# Patient Record
Sex: Male | Born: 1965 | Race: White | Hispanic: Yes | Marital: Married | Smoking: Former smoker
Health system: Southern US, Community
[De-identification: ages and names within clinical notes are randomized; demographics above are authoritative.]

## PROBLEM LIST (undated history)

## (undated) DIAGNOSIS — Z87442 Personal history of urinary calculi: Secondary | ICD-10-CM

## (undated) DIAGNOSIS — Z860101 Personal history of adenomatous and serrated colon polyps: Secondary | ICD-10-CM

## (undated) DIAGNOSIS — Z86010 Personal history of colonic polyps: Secondary | ICD-10-CM

## (undated) DIAGNOSIS — K621 Rectal polyp: Secondary | ICD-10-CM

---

## 1983-11-09 DIAGNOSIS — S82401A Unspecified fracture of shaft of right fibula, initial encounter for closed fracture: Secondary | ICD-10-CM

## 1983-11-09 HISTORY — DX: Unspecified fracture of shaft of right fibula, initial encounter for closed fracture: S82.401A

## 2004-09-10 ENCOUNTER — Ambulatory Visit: Payer: Self-pay | Admitting: Internal Medicine

## 2004-09-10 ENCOUNTER — Ambulatory Visit (HOSPITAL_COMMUNITY): Admission: RE | Admit: 2004-09-10 | Discharge: 2004-09-10 | Payer: Self-pay | Admitting: Internal Medicine

## 2004-10-20 ENCOUNTER — Ambulatory Visit: Payer: Self-pay | Admitting: Internal Medicine

## 2012-04-17 ENCOUNTER — Emergency Department (HOSPITAL_COMMUNITY)
Admission: EM | Admit: 2012-04-17 | Discharge: 2012-04-18 | Disposition: A | Discharge status: Home / Self Care | Payer: Self-pay | Attending: Emergency Medicine | Admitting: Emergency Medicine

## 2012-04-17 ENCOUNTER — Encounter (HOSPITAL_COMMUNITY): Payer: Self-pay | Admitting: *Deleted

## 2012-04-17 DIAGNOSIS — N200 Calculus of kidney: Secondary | ICD-10-CM | POA: Insufficient documentation

## 2012-04-17 DIAGNOSIS — R109 Unspecified abdominal pain: Secondary | ICD-10-CM | POA: Insufficient documentation

## 2012-04-17 DIAGNOSIS — N1330 Unspecified hydronephrosis: Secondary | ICD-10-CM | POA: Insufficient documentation

## 2012-04-17 LAB — DIFFERENTIAL
Eosinophils Absolute: 0.1 10*3/uL (ref 0.0–0.7)
Lymphs Abs: 3 10*3/uL (ref 0.7–4.0)
Monocytes Absolute: 0.9 10*3/uL (ref 0.1–1.0)
Monocytes Relative: 6 % (ref 3–12)
Neutro Abs: 11.6 10*3/uL — ABNORMAL HIGH (ref 1.7–7.7)
Neutrophils Relative %: 74 % (ref 43–77)

## 2012-04-17 LAB — CBC
HCT: 42.9 % (ref 39.0–52.0)
Hemoglobin: 15 g/dL (ref 13.0–17.0)
MCH: 29.9 pg (ref 26.0–34.0)
RBC: 5.02 MIL/uL (ref 4.22–5.81)

## 2012-04-17 NOTE — ED Notes (Addendum)
Pt stated he stated having flank back pain on his left side at 10:00pm. Pt states he has not taken anything for it/ pt states his pain is at a level ten. Pt states that he has been nauseated but has not vomited, pt states his had the urge to urinated but is unable to

## 2012-04-17 NOTE — ED Notes (Signed)
Lt flank pain for the past 2 hours.. No bloody urine.  He canot void

## 2012-04-18 ENCOUNTER — Emergency Department (HOSPITAL_COMMUNITY): Payer: Self-pay

## 2012-04-18 ENCOUNTER — Encounter (HOSPITAL_COMMUNITY): Payer: Self-pay | Admitting: Radiology

## 2012-04-18 LAB — COMPREHENSIVE METABOLIC PANEL
Alkaline Phosphatase: 57 U/L (ref 39–117)
BUN: 29 mg/dL — ABNORMAL HIGH (ref 6–23)
Chloride: 101 mEq/L (ref 96–112)
Creatinine, Ser: 1.13 mg/dL (ref 0.50–1.35)
GFR calc Af Amer: 88 mL/min — ABNORMAL LOW (ref >90)
GFR calc non Af Amer: 76 mL/min — ABNORMAL LOW (ref >90)
Glucose, Bld: 155 mg/dL — ABNORMAL HIGH (ref 70–99)
Potassium: 3.4 mEq/L — ABNORMAL LOW (ref 3.5–5.1)
Total Bilirubin: 0.7 mg/dL (ref 0.3–1.2)

## 2012-04-18 LAB — URINALYSIS, ROUTINE W REFLEX MICROSCOPIC
Bilirubin Urine: NEGATIVE
Glucose, UA: NEGATIVE mg/dL
Ketones, ur: 15 mg/dL — AB
Nitrite: NEGATIVE
Specific Gravity, Urine: 1.021 (ref 1.005–1.030)
pH: 6.5 (ref 5.0–8.0)

## 2012-04-18 LAB — URINE MICROSCOPIC-ADD ON

## 2012-04-18 MED ORDER — OXYCODONE-ACETAMINOPHEN 5-325 MG PO TABS: 2.0000 | ORAL_TABLET | ORAL | Status: AC | PRN

## 2012-04-18 MED ORDER — ONDANSETRON HCL 4 MG PO TABS: 4.0000 mg | ORAL_TABLET | Freq: Four times a day (QID) | ORAL | Status: AC

## 2012-04-18 MED ORDER — IBUPROFEN 800 MG PO TABS: 800.0000 mg | ORAL_TABLET | Freq: Three times a day (TID) | ORAL | Status: AC

## 2012-04-18 MED ORDER — HYDROMORPHONE HCL PF 1 MG/ML IJ SOLN
1.0000 mg | Freq: Once | INTRAMUSCULAR | Status: AC
Start: 2012-04-18 — End: 2012-04-18
  Administered 2012-04-18: 1 mg via INTRAVENOUS
  Filled 2012-04-18: qty 1

## 2012-04-18 MED ORDER — TAMSULOSIN HCL 0.4 MG PO CAPS: 0.4000 mg | ORAL_CAPSULE | Freq: Every day | ORAL | Status: DC

## 2012-04-18 MED ORDER — ONDANSETRON HCL 4 MG/2ML IJ SOLN
4.0000 mg | Freq: Once | INTRAMUSCULAR | Status: AC
  Administered 2012-04-18: 4 mg via INTRAVENOUS
  Filled 2012-04-18: qty 2

## 2012-04-18 NOTE — Discharge Instructions (Signed)
You were diagnosed with a kidney stone today.  Please contact [facility] today to schedule follow-up.  Return to the ER for worsening pain, nausea, or vomiting despite medications, fever, or other concerning symptoms.  Piedras o clculos renales (Kidney Stones)  Los clculos renales (litiasis ureteral) son depsitos que se forman en el interior de los riones. El dolor intenso es causado por el movimiento de la piedra a travs del tracto urinario. Cuando la piedra se mueve, el ureter hace un espasmo alrededor de la misma. El clculo generalmente se elimina con la [place]. CAUSAS  Un trastorno que hace que ciertas glndulas del cuello produzcan demasiada hormona paratiroidea (hiperparatiroidismo primario).   La acumulacin de cristales de cido rico.   Estrechamiento del urter.   Obstruccin en el rin presente al nacer (obstruccin congnita).   Cirugas previas del rin o los urteres.   Varias infecciones renales.  SNTOMAS  Ganas de vomitar (nuseas ).   Vmitos   Sangre en la orina (hematuria).   Dolor que generalmente se expande (irradia) hacia la ingle.   Ganas de orinar con frecuencia o de manera urgente.  DIAGNSTICO  Historia clnica y examen fsico.   Anlisis de sangre y [place].   Tomografas computadas.   En algunos casos se realiza un examen del interior de la vejiga (citoscopa).  TRATAMIENTO  Observacin.   Aumente su ingesta de lquidos.   Ser necesaria la ciruga si tiene dolor muy intenso o la obstruccin persiste.  El tamao, la ubicacin y la composicin qumica son variables importantes que determinarn la eleccin correcta de tratamiento para su caso. Comunquese con su mdico para comprender mejor su afeccin, de modo que pueda minimizar los riesgos de lesiones en el rin.  INSTRUCCIONES PARA EL CUIDADO DOMICILIARIO  Beba gran cantidad de lquidos para mantener la orina de tono claro o color amarillo plido.   Filtre toda la orina (le  proporcionarn un filtro). Guarde todas las partculas y piedras para que las vea el profesional que lo asiste. La piedra que causa el dolor puede ser tan diminuta como un grano de sal. Es muy importante que utilice el filtro todas las veces que orine. Obtener la piedra permitir al profesional Financial risk analyst y Investment banker, operational que de hecho ha sido expulsada.   Utilice los medicamentos de venta libre o de prescripcin para Chief Technology Officer, Environmental health practitioner o la [place], segn se lo indique el profesional que lo asiste.   Cumpla con las citas de seguimiento tal como le indic el profesional que lo asiste.   Puede necesitar un seguimiento especializado con radiografas. La ausencia de dolor no siempre significa que el clculo se ha eliminado. Puede ser simplemente que haya dejado de moverse. Si el paso de orina permanece completamente obstruido, puede producirse la prdida de la funcin renal o la destruccin del rin. Es su responsabilidad cumplir el seguimiento y las radiografas indicadas. Pueden realizarse ecografas del rin para verificar obsstrucciones y Training and development officer del mismo. Las ecografas no producen radiacin y pueden realizarse fcilmente en cuestin de minutos.  SOLICITE ATENCIN MDICA DE INMEDIATO SI:  No puede controlar el dolor con los medicamentos que le han prescrito.   Tiene fiebre.   La gravedad o intensidad del dolor aumenta luego de 18 horas y no se reduce con los medicamentos para Chief Technology Officer.   Presenta un nuevo episodio de dolor abdominal.   Se marea o pierde el conocimiento.  EST SEGURO QUE:   Comprende las instrucciones para el alta mdica.   Controlar su  enfermedad.   Solicitar atencin mdica de inmediato segn las indicaciones.  Document Released: 10/25/2005 Document Revised: 10/14/2011 [facility] Patient Information 2012 [place], [place].

## 2012-04-18 NOTE — ED Notes (Signed)
Reassessed patients pain is at a 3

## 2012-04-18 NOTE — ED Provider Notes (Signed)
History     CSN: [id number]  Arrival date & time 04/17/12  2234   First MD Initiated Contact with Patient 04/17/12 2342      Chief Complaint  Patient presents with  . Flank Pain    (Consider location/radiation/quality/duration/timing/severity/associated sxs/prior treatment) HPI 46 year old male presents emergency department complaining of left flank pain. Patient reports pain started acutely around 9 PM. Patient went to go lay down after onset of pain, his son saw him up walking around a short time later and noted that he was very diaphoretic. Patient has had nausea without vomiting. No prior history of similar symptoms. No prior kidney stones. Patient denies any trauma or injury to the region. Patient reports he has the urge to urinate but cannot produce urine. No fevers, last bowel movement was yesterday and normal. No other medical history  History reviewed. No pertinent past medical history.  History reviewed. No pertinent past surgical history.  No family history on file.  History  Substance Use Topics  . Smoking status: Never Smoker   . Smokeless tobacco: Not on file  . Alcohol Use: Yes      Review of Systems  All other systems reviewed and are negative.    Allergies  Review of patient's allergies indicates no known allergies.  Home Medications  No current outpatient prescriptions on file.  BP 121/72  Pulse 80  Temp(Src) 97.4 F (36.3 C) (Oral)  Resp 20  SpO2 100%  Physical Exam  Nursing note and vitals reviewed. Constitutional: He appears well-developed and well-nourished. He appears distressed (Uncomfortable appearing).  HENT:  Head: Normocephalic.  Neck: Normal range of motion. Neck supple. No JVD present. No tracheal deviation present. No thyromegaly present.  Cardiovascular: Normal rate, regular rhythm, normal heart sounds and intact distal pulses.  Exam reveals no gallop and no friction rub.   No murmur heard. Pulmonary/Chest: Effort normal and  breath sounds normal. No respiratory distress. He has no wheezes. He has no rales. He exhibits no tenderness.  Abdominal: Soft. Bowel sounds are normal. He exhibits no distension and no mass. There is no tenderness. There is no rebound and no guarding.  Musculoskeletal: Normal range of motion. He exhibits no edema and no tenderness.  Lymphadenopathy:    He has no cervical adenopathy.  Skin: Skin is warm and dry. No rash noted. No erythema. No pallor.    ED Course  Procedures (including critical care time)  Labs Reviewed  CBC - Abnormal; Notable for the following:    WBC 15.6 (*)    All other components within normal limits  DIFFERENTIAL - Abnormal; Notable for the following:    Neutro Abs 11.6 (*)    All other components within normal limits  COMPREHENSIVE METABOLIC PANEL - Abnormal; Notable for the following:    Potassium 3.4 (*)    Glucose, Bld 155 (*)    BUN 29 (*)    GFR calc non Af Amer 76 (*)    GFR calc Af Amer 88 (*)    All other components within normal limits  URINALYSIS, ROUTINE W REFLEX MICROSCOPIC   Ct Abdomen Pelvis Wo Contrast  04/18/2012  *RADIOLOGY REPORT*  Clinical Data:  Left-sided flank pain.  CT ABDOMEN AND PELVIS WITHOUT CONTRAST (CT UROGRAM)  Technique: Contiguous axial images of the abdomen and pelvis without oral or intravenous contrast were obtained.  Comparison: None  Findings:  Exam is limited for evaluation of entities other than urinary tract calculi due to lack of oral or intravenous contrast.  Clear lung bases.  Normal heart size without pericardial or pleural effusion.  Subtle air fluid level in the distal esophagus on image 6.  Normal liver, spleen, stomach, pancreas, gallbladder, biliary tract, adrenal glands, right kidney.  Moderate left-sided hydroureteronephrosis continues to the level of a 2 mm stone either at or just exiting the left ureterovesicular junction.  Image 64.  No retroperitoneal or retrocrural adenopathy.  Normal colon and terminal  ileum.  Appendix likely identified on image 46; no right lower quadrant inflammation. Normal small bowel without abdominal ascites.  Tiny fat containing left inguinal hernia. No pelvic adenopathy. Normal prostate, without significant free pelvic fluid.  Minimal wedging of the T11 vertebral body may be within normal variation. Mild disc bulge at L4-L5.  IMPRESSION: 2 mm stone either at or just exited the left ureter vesicular junction.  Mild to moderate secondary obstruction.  Esophageal air fluid level suggests dysmotility or gastroesophageal reflux.  Original Report Authenticated By: Consuello Bossier, M.D.     1. Kidney stone on left side       MDM  46 year old male with acute onset of left flank pain. Patient noted to be significantly uncomfortable, and able to stay still in the bed appears to have renal colic. We'll get CT abdomen pelvis without contrast to identify potential stone.        Olivia Mackie, MD 04/18/12 [phone]

## 2012-04-18 NOTE — ED Notes (Signed)
Urine sent through [facility]

## 2014-11-08 DIAGNOSIS — S92001A Unspecified fracture of right calcaneus, initial encounter for closed fracture: Secondary | ICD-10-CM

## 2014-11-08 HISTORY — DX: Unspecified fracture of right calcaneus, initial encounter for closed fracture: S92.001A

## 2015-02-11 ENCOUNTER — Emergency Department (HOSPITAL_COMMUNITY)
Admission: EM | Admit: 2015-02-11 | Discharge: 2015-02-11 | Disposition: A | Discharge status: Home / Self Care | Payer: No Typology Code available for payment source | Source: Home / Self Care | Attending: Family Medicine | Admitting: Family Medicine

## 2015-02-11 ENCOUNTER — Encounter (HOSPITAL_COMMUNITY): Payer: Self-pay | Admitting: Emergency Medicine

## 2015-02-11 ENCOUNTER — Emergency Department (INDEPENDENT_AMBULATORY_CARE_PROVIDER_SITE_OTHER): Payer: No Typology Code available for payment source

## 2015-02-11 DIAGNOSIS — S92002A Unspecified fracture of left calcaneus, initial encounter for closed fracture: Secondary | ICD-10-CM

## 2015-02-11 NOTE — ED Notes (Signed)
Injured Sunday playing soccer.  Swelling and bruising to right ankle.  Pedal pulses 2 plus

## 2015-02-11 NOTE — ED Provider Notes (Signed)
Caleb Bryan is a 49 y.o. male who presents to Urgent Care today for right heel injury. Patient was playing soccer 2 days ago when he felt is a something kicked him in the heel. He notes pain and swelling and difficulty bearing weight in his right heel. No radiating pain weakness or numbness. He's tried ibuprofen which helps. No fevers or chills.   History reviewed. No pertinent past medical history. History reviewed. No pertinent past surgical history. History  Substance Use Topics  . Smoking status: Never Smoker   . Smokeless tobacco: Not on file  . Alcohol Use: Yes   ROS as above Medications: No current facility-administered medications for this encounter.   Current Outpatient Prescriptions  Medication Sig Dispense Refill  . ibuprofen (ADVIL,MOTRIN) 200 MG tablet Take 200 mg by mouth every 6 (six) hours as needed.    . Tamsulosin HCl (FLOMAX) 0.4 MG CAPS Take 1 capsule (0.4 mg total) by mouth daily. 10 capsule 0   No Known Allergies   Exam:  BP 111/71 mmHg  Pulse 85  Temp(Src) 98.9 F (37.2 C) (Oral)  Resp 14  SpO2 100% Gen: Well NAD Right leg: Knee nontender normal motion Ankle swollen nontender medial and lateral malleolus. Foot swollen with ecchymosis along the lateral and medial calcaneus. No defects palpated along the Achilles tendon. Nontender along the courses of the posterior tibialis and peroneal tendons. Pulses capillary refill sensation intact distally.  A well formed posterior leg splint with stirrups was applied  No results found for this or any previous visit (from the past 24 hour(s)). Dg Ankle Complete Right  02/11/2015   CLINICAL DATA:  Soccer injury on Sunday with ankle bruising and swelling. Initial encounter.  EXAM: RIGHT ANKLE - COMPLETE 3+ VIEW  COMPARISON:  None.  FINDINGS: Comminuted calcaneus fracture with mild loss of height. There is visible extension to the subtalar joint. Normal hindfoot alignment.  Ankle joint effusion.  No evidence of acute  fracture at the ankle. Suspect remote oblique distal fibula fracture.  IMPRESSION: 1. Comminuted calcaneus fracture with subtalar extension. 2. Ankle joint effusion.   Electronically Signed   By: Monte Fantasia M.D.   On: 02/11/2015 19:43   Dg Foot Complete Right  02/11/2015   CLINICAL DATA:  Bruising around heel and foot after soccer injury on Sunday. Initial encounter.  EXAM: RIGHT FOOT COMPLETE - 3+ VIEW  COMPARISON:  None.  FINDINGS: There is comminuted fracturing of the calcaneus. There is longitudinal component without up lifting. There is mild loss of central body height. Subtalar extension is present. Hindfoot alignment is maintained. No additional fracture noted. Anticipate CT followup.  IMPRESSION: 1. Comminuted calcaneus fracture with subtalar extension. 2. Ankle joint effusion.   Electronically Signed   By: Monte Fantasia M.D.   On: 02/11/2015 19:45    Assessment and Plan: 49 y.o. male with calcaneus fracture. Treat with posterior leg splint with stirrups and NSAIDs. Discussed case with Dr. Percell Miller. Follow-up with Dr. Alain Marion tomorrow.  Discussed warning signs or symptoms. Please see discharge instructions. Patient expresses understanding.     Gregor Hams, MD 02/11/15 2012

## 2015-02-11 NOTE — Discharge Instructions (Signed)
Thank you for coming in today. Follow up with Dr. Percell Miller Tomorrow at 8:30am.    Reparacin de la fractura del calcneo (Calcaneal Fracture Repair) Hay muchas formas diferentes de tratar las fracturas del hueso largo irregular del pie que forma el taln (calcneo). Las fracturas del hueso calcneo pueden tratarse con:   Inmovilizacin: la fractura ser [employer] est, sin cambiar la posicin de la fractura involucrada.  Con reduccin cerrada: los huesos se Pension scheme manager en la posicin correcta sin abrir Architectural technologist de Electrical engineer.  Reduccin abierta y fijacin interna: el cirujano abre el sitio de Electrical engineer, y las partes seas se fijan en su lugar con algn tipo de elemento rgido (como tornillos).  Artrodesis primaria: la articulacin ha sufrido un dao de tal magnitud que se Customer service manager tratamiento un procedimiento que dejar la articulacin rgida de [employer]. Habitualmente disminuye la funcin, sin embargo, generalmente la articulacin queda libre de [place]. INFORME A SU MDICO:  Cualquier alergia que tenga.  Todos los [employer] [place], incluyendo vitaminas, hierbas, gotas oftlmicas, cremas y medicamentos de venta libre.  Problemas previos que usted o los [employer] de su familia hayan tenido con el uso de anestsicos.  Enfermedades de [employer].  Cirugas previas.  Padecimientos mdicos. RIESGOS Y COMPLICACIONES En general, la reparacin de la fractura del calcneo es un procedimiento seguro. Sin embargo, Games developer procedimiento, pueden surgir complicaciones. Las complicaciones posibles son:   Hinchazn del pie y el tobillo.  Infeccin de la herida o del hueso.  Artritis.  Dolor crnico en el pie.  Lesiones en los nervios.  Cogulos de [employer] piernas o los pulmones. ANTES DEL PROCEDIMIENTO  Consulte a su mdico si debe cambiar o suspender los medicamentos que toma habitualmente. Es posible que  deba dejar de tomar ciertos medicamentos antes de la [place].  El mdico revisar las radiografas y los estudios por imgenes. El Clinical cytogeneticist cul es el mejor abordaje quirrgico para Psychologist, occupational.  No coma ni beba nada durante al menos 8 horas previas al procedimiento, o segn le haya indicado su mdico.  Si fuma, no lo haga al [employer] previas a la [place].  Planifique para que alguien lo lleve de vuelta a su casa. Tambin pdale a alguna persona que lo ayude con sus actividades mientras se recupera. PROCEDIMIENTO   Le darn un medicamento para que pueda relajarse (sedante). Le administrarn medicamentos para hacerlo dormir durante el procedimiento (anestesia general). Estos medicamentos se aplican a travs de una va intravenosa (IV) que se coloca en una vena.  Tambin podrn administrarle un bloqueante nervioso o medicamento para adormecer (anestsico local )  para que se sienta confortable.  Una vez que se duerma, le higienizarn el pie y lo rasurarn si es necesario.  El [place] podr usar una tcnica percutnea o una ciruga abierta:  En el abordaje percutneo, se realizan pequeos cortes y se colocan clavos para reparar la fractura.  En la tcnica abierta, se realiza un corte a lo largo de Tourist information centre manager externa del pie los trozos de huesos vuelven a colocarse juntos con instrumental. Podrn dejarle un drenaje para recolectar el lquido. Se retirar a los 3-4 [employer] del procedimiento.  El cirujano cerrar la incisin con grapas o puntos de sutura. DESPUS DEL PROCEDIMIENTO  Despus de la ciruga lo llevarn al rea de recuperacin donde un enfermero lo cuidar y Chief Technology Officer su evolucin durante 1 a 3 horas. [place]  que despierte, se encuentre estabilizado y pueda ingerir lquidos correctamente, si no ocurre un imprevisto, podr volver a su casa.  Le darn medicamentos para el dolor si los necesita.  Le quitarn la va IV y el catter antes de  darle el alta. Document Released: 08/04/2005 Document Revised: 08/15/2013 [facility] Patient Information 2015 [place]. This information is not intended to replace advice given to you by your health care provider. Make sure you discuss any questions you have with your health care provider.

## 2015-02-12 ENCOUNTER — Other Ambulatory Visit: Payer: Self-pay | Admitting: Orthopedic Surgery

## 2015-02-12 DIAGNOSIS — R52 Pain, unspecified: Secondary | ICD-10-CM

## 2015-02-13 ENCOUNTER — Encounter (HOSPITAL_COMMUNITY): Payer: Self-pay

## 2015-02-13 ENCOUNTER — Ambulatory Visit (HOSPITAL_COMMUNITY)
Admission: RE | Admit: 2015-02-13 | Discharge: 2015-02-13 | Disposition: A | Discharge status: Home / Self Care | Payer: PRIVATE HEALTH INSURANCE | Source: Ambulatory Visit | Attending: Orthopedic Surgery | Admitting: Orthopedic Surgery

## 2015-02-13 DIAGNOSIS — X58XXXA Exposure to other specified factors, initial encounter: Secondary | ICD-10-CM | POA: Insufficient documentation

## 2015-02-13 DIAGNOSIS — R52 Pain, unspecified: Secondary | ICD-10-CM

## 2015-02-13 DIAGNOSIS — S92001A Unspecified fracture of right calcaneus, initial encounter for closed fracture: Secondary | ICD-10-CM | POA: Insufficient documentation

## 2015-02-13 DIAGNOSIS — Y9379 Activity, other specified sports and athletics: Secondary | ICD-10-CM | POA: Insufficient documentation

## 2015-02-14 ENCOUNTER — Other Ambulatory Visit: Payer: No Typology Code available for payment source

## 2015-02-27 ENCOUNTER — Ambulatory Visit (HOSPITAL_COMMUNITY): Payer: No Typology Code available for payment source

## 2015-02-27 ENCOUNTER — Ambulatory Visit (HOSPITAL_COMMUNITY): Admission: RE | Admit: 2015-02-27 | Payer: Self-pay | Source: Ambulatory Visit

## 2016-02-25 IMAGING — DX DG FOOT COMPLETE 3+V*R*
3 series · 3 of 3 positions shown · non-contrast
Comparison: None.

CLINICAL DATA: Bruising around heel and foot after soccer injury on
[REDACTED]. Initial encounter.

EXAM:
RIGHT FOOT COMPLETE - 3+ VIEW

[foot ap]
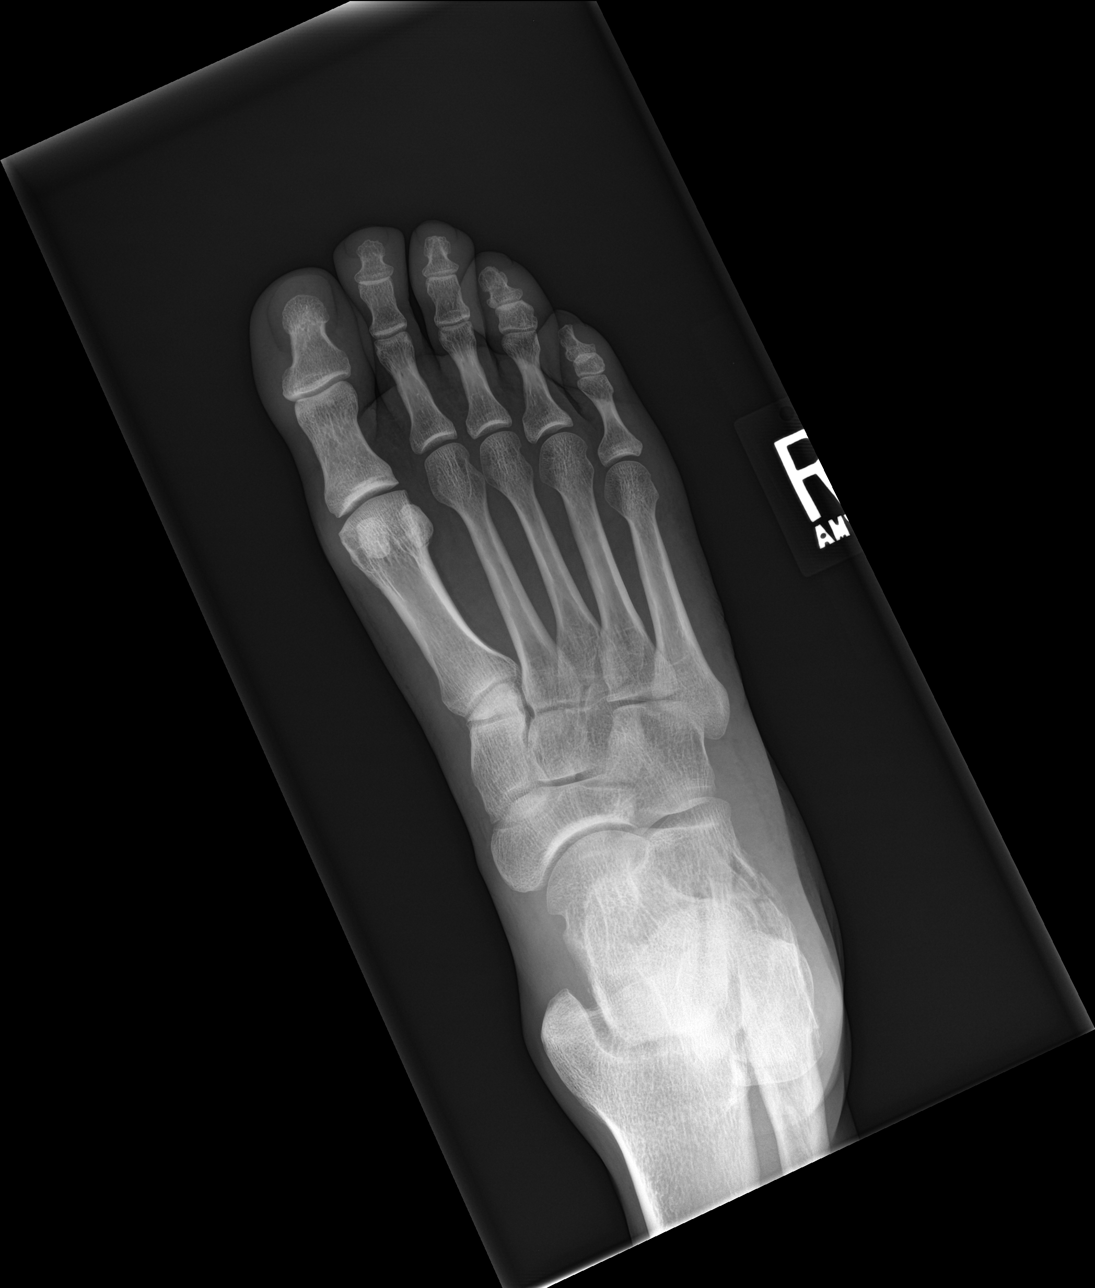

[foot obl]
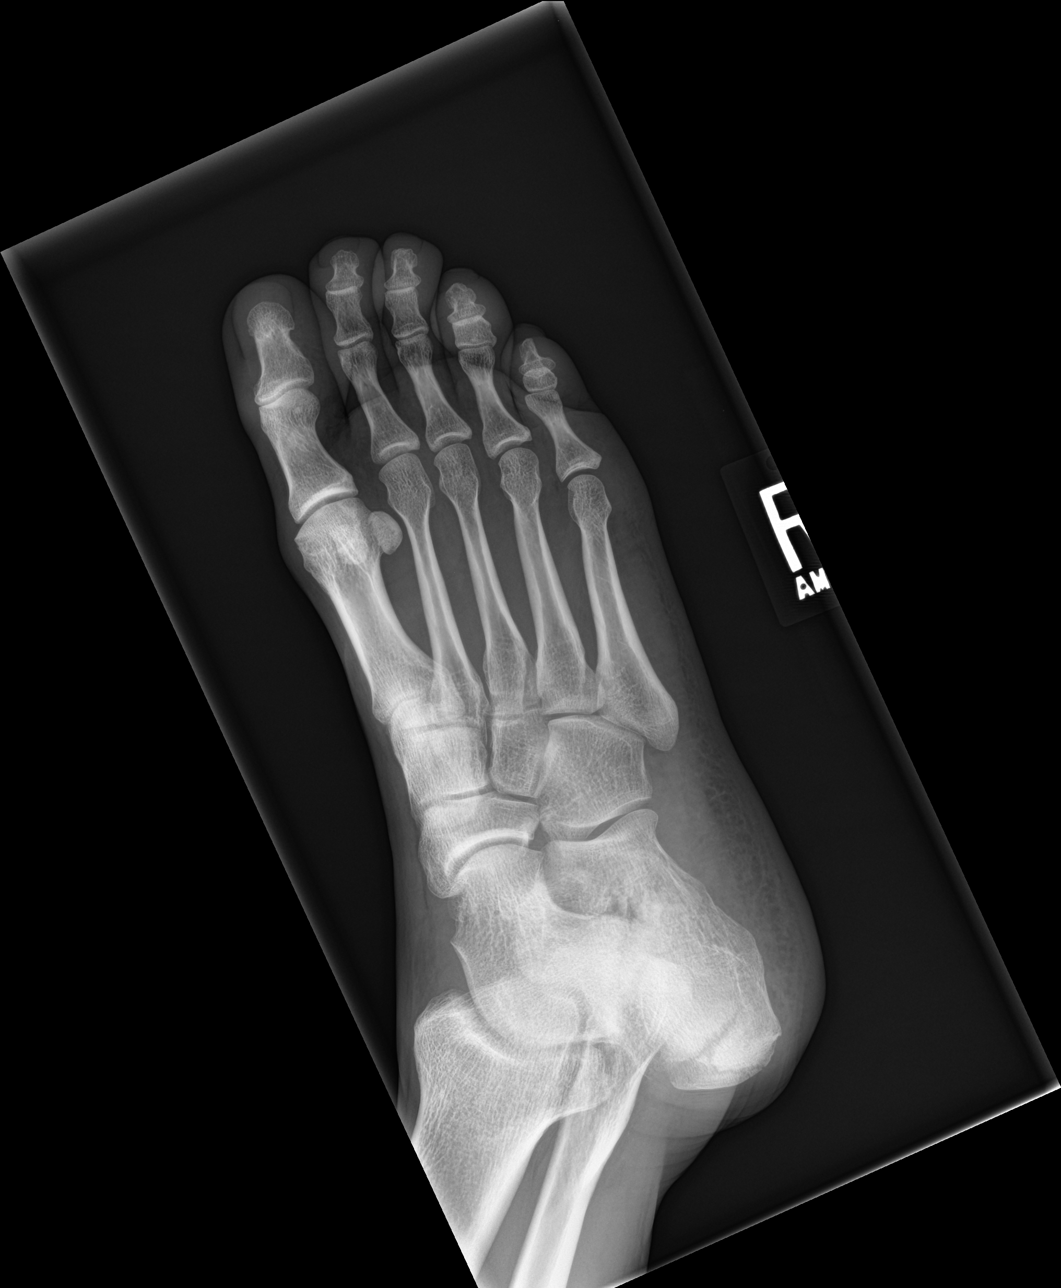

[foot lat]
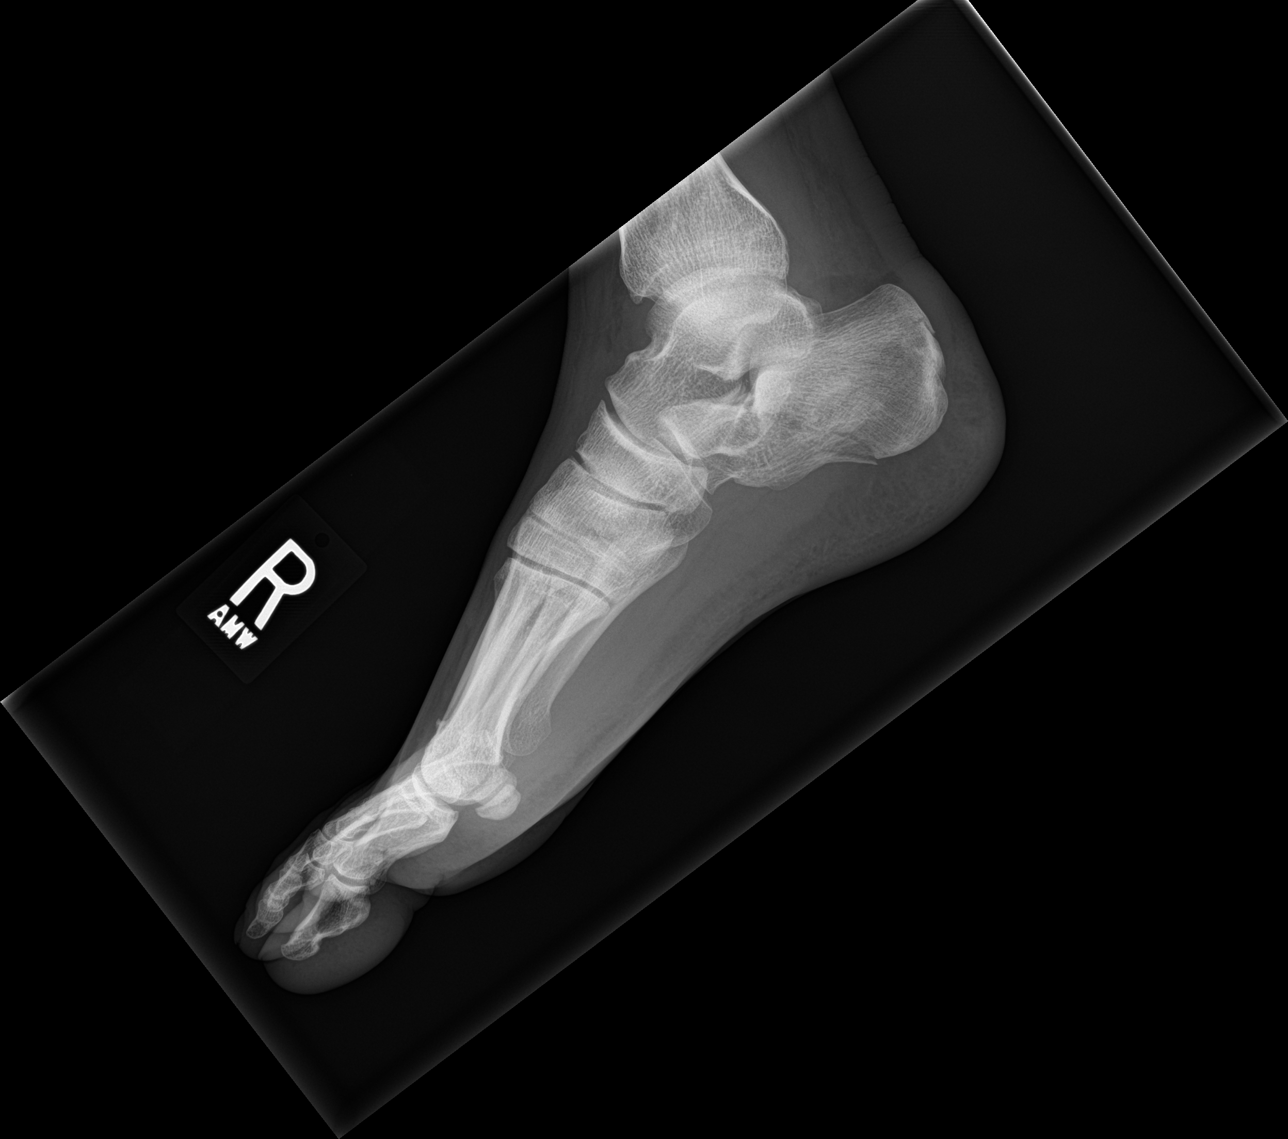

[3 of 3 positions shown; findings below may reference images not displayed]

FINDINGS: There is comminuted fracturing of the calcaneus. There is
longitudinal component without up lifting. There is mild loss of
central body height. Subtalar extension is present. Hindfoot
alignment is maintained. No additional fracture noted. Anticipate CT
followup.
IMPRESSION: 1. Comminuted calcaneus fracture with subtalar extension.
2. Ankle joint effusion.

## 2016-02-25 IMAGING — DX DG ANKLE COMPLETE 3+V*R*
3 series · 3 of 3 positions shown · non-contrast
Comparison: None.

CLINICAL DATA: Soccer injury on [REDACTED] with ankle bruising and
swelling. Initial encounter.

EXAM:
RIGHT ANKLE - COMPLETE 3+ VIEW

[ankle ap]
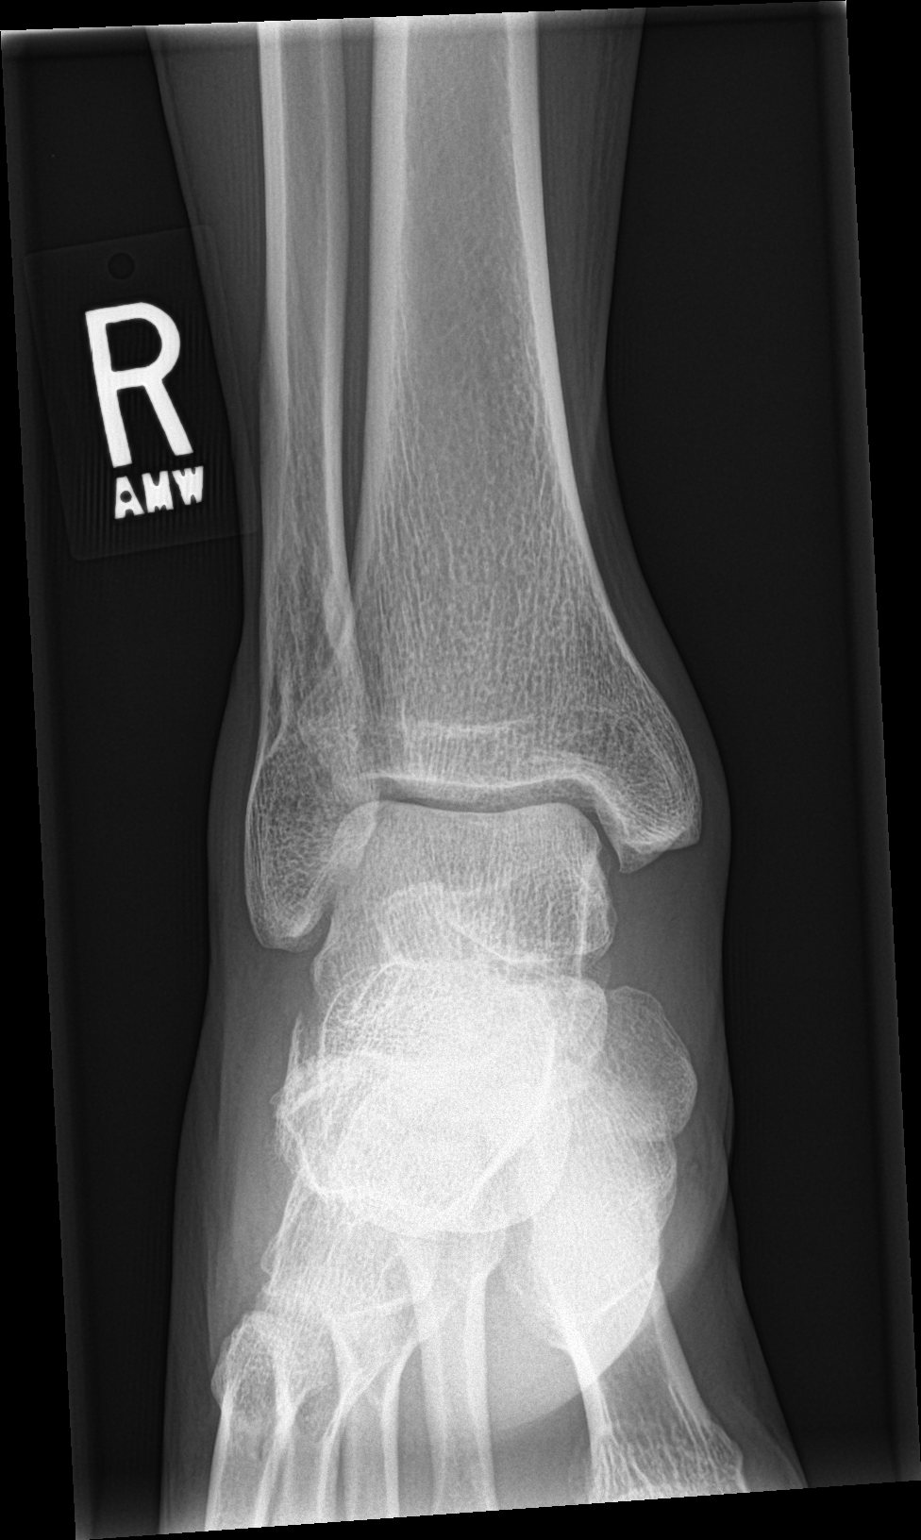

[ankle obl]
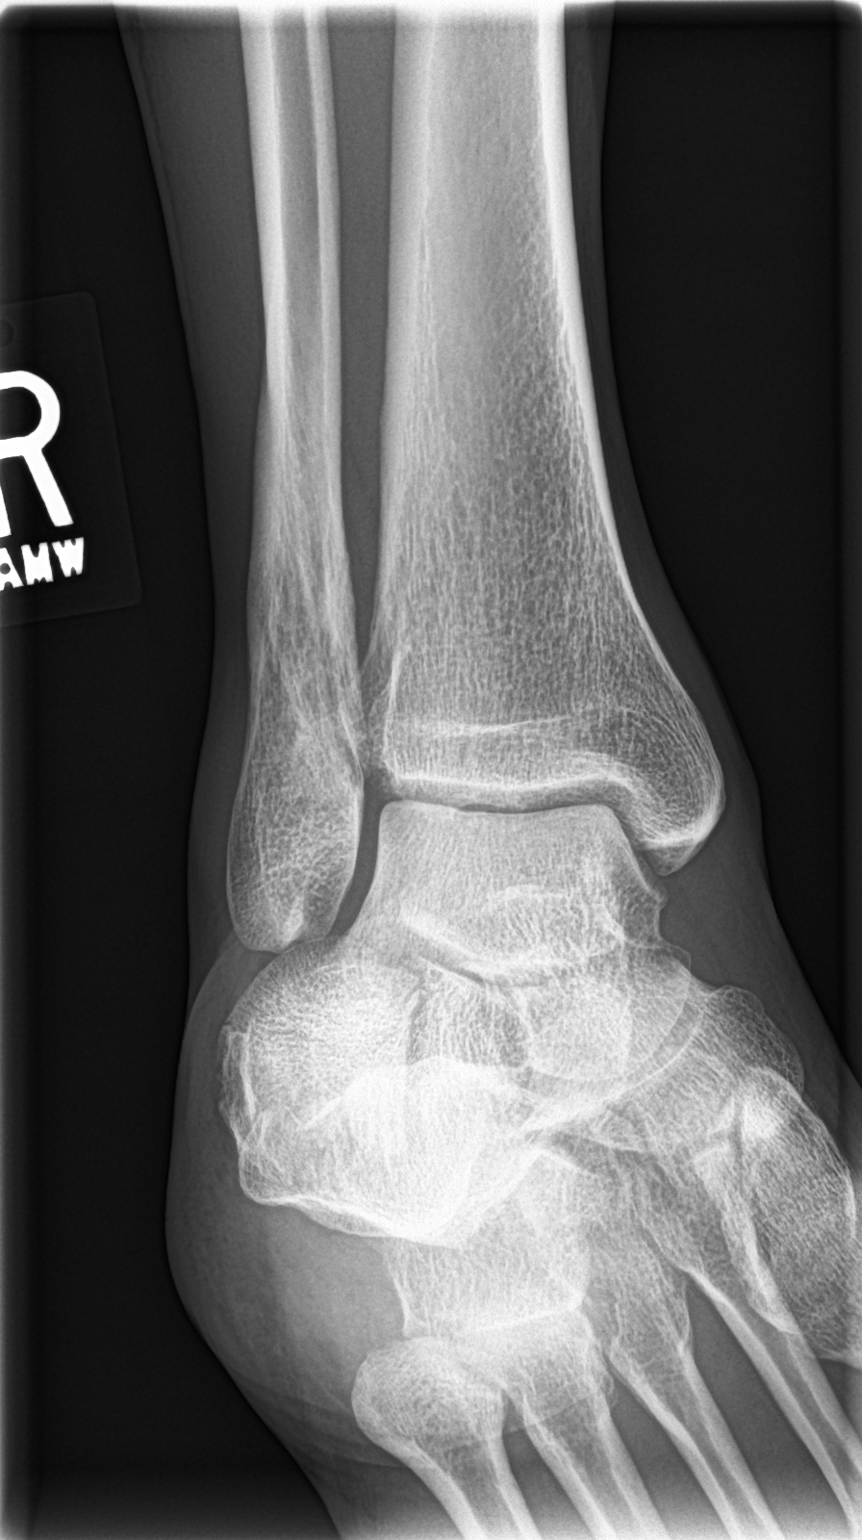

[ankle lat]
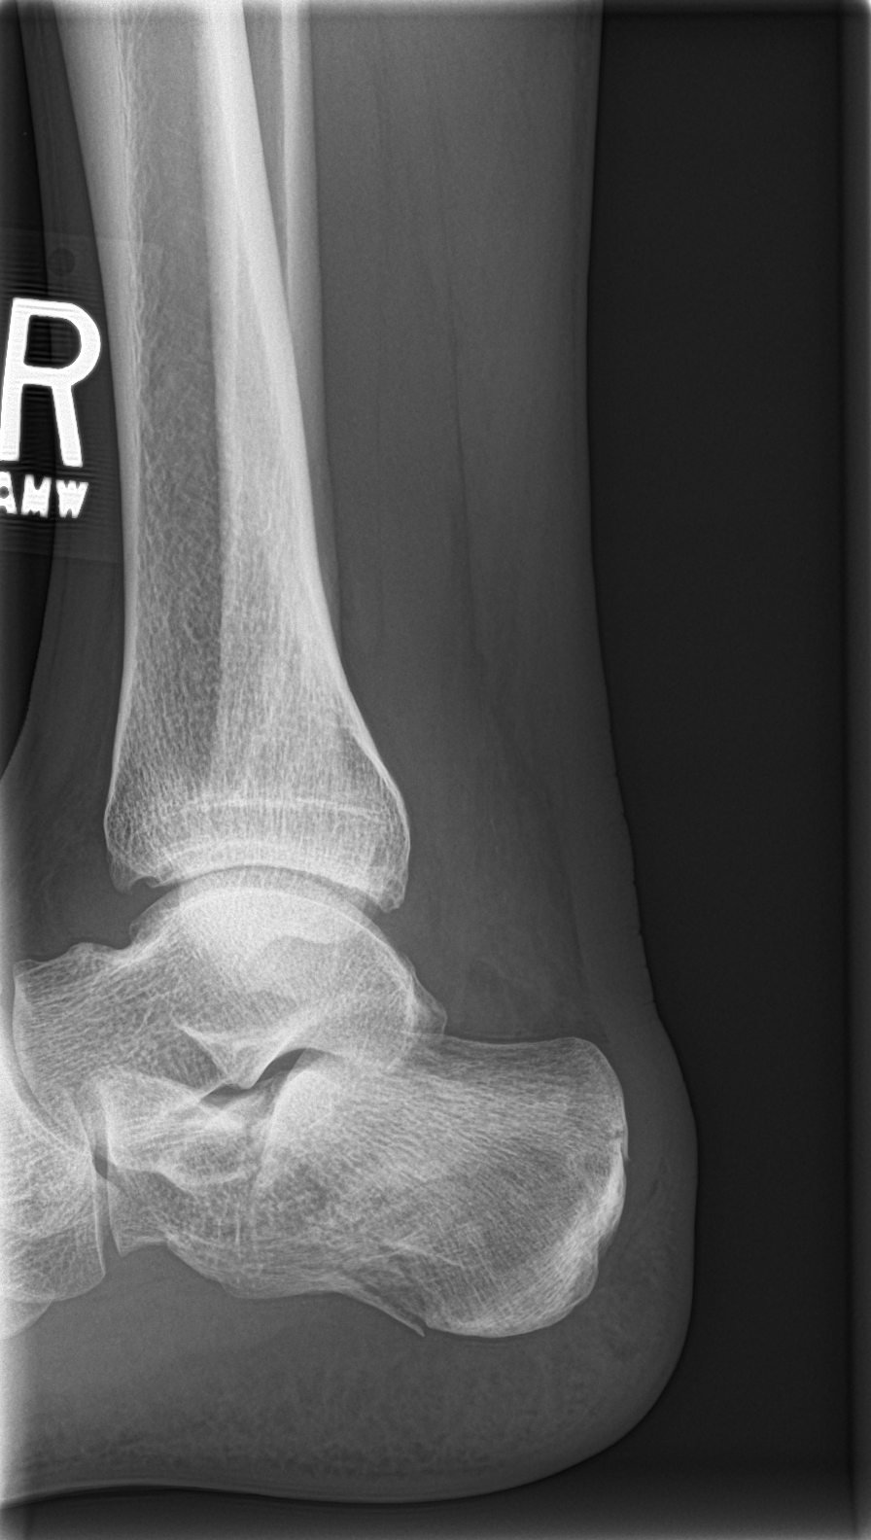

[3 of 3 positions shown; findings below may reference images not displayed]

FINDINGS: Comminuted calcaneus fracture with mild loss of height. There is
visible extension to the subtalar joint. Normal hindfoot alignment.

Ankle joint effusion.

No evidence of acute fracture at the ankle. Suspect remote oblique
distal fibula fracture.
IMPRESSION: 1. Comminuted calcaneus fracture with subtalar extension.
2. Ankle joint effusion.

## 2016-12-22 ENCOUNTER — Ambulatory Visit (INDEPENDENT_AMBULATORY_CARE_PROVIDER_SITE_OTHER): Payer: Self-pay | Admitting: Internal Medicine

## 2016-12-22 ENCOUNTER — Encounter (INDEPENDENT_AMBULATORY_CARE_PROVIDER_SITE_OTHER): Payer: Self-pay

## 2016-12-22 DIAGNOSIS — K648 Other hemorrhoids: Secondary | ICD-10-CM | POA: Insufficient documentation

## 2016-12-22 DIAGNOSIS — Z87891 Personal history of nicotine dependence: Secondary | ICD-10-CM

## 2016-12-22 DIAGNOSIS — Z833 Family history of diabetes mellitus: Secondary | ICD-10-CM

## 2016-12-22 MED ORDER — HYDROCORTISONE 1 % EX CREA: TOPICAL_CREAM | CUTANEOUS | 0 refills | Status: DC

## 2016-12-22 NOTE — Progress Notes (Signed)
   CC: Internal Hemorrhoids  HPI:  [name] is a 52 y.o. Spanish Speaking male with PMH of Internal Hemorrhoids who presents to re-establish care with [facility] and management of his internal hemorrhoids. Tele interpretor was used for communication.  Internal Hemorrhoids: Patient reports seeing three doctors in the past with the third doctor diagnosing him with internal hemorrhoids. Patient denies any BRBPR, pruritus, or pain with defecation. He sometimes sees spots of blood on toilet paper after wiping. He has noticed seeing small "flesh balls" on occasion when he strains to defecate. He denies seeing any blood clots. He reports having excess flatulence. He denies any diarrhea, constipation, melena. He does not take any medications.   Past Medical History:  Diagnosis Date  . Internal hemorrhoids without complication    Past Surgical History:  Procedure Laterality Date  . right ankle surgery     Social History   Social History  . Marital status: Married    Spouse name: N/A  . Number of children: N/A  . Years of education: N/A   Social History Main Topics  . Smoking status: Former Research scientist (life sciences)  . Smokeless tobacco: None     Comment: Quit 20 years ago  . Alcohol use Yes  . Drug use: No  . Sexual activity: Not Asked   Other Topics Concern  . None   Social History Narrative  . None   Family History  Problem Relation Age of Onset  . Diabetes Father    No Known Allergies   Review of Systems:   Review of Systems  Constitutional: Negative for chills, diaphoresis, fever, malaise/fatigue and weight loss.  Respiratory: Negative for cough, hemoptysis, sputum production, shortness of breath and wheezing.   Cardiovascular: Negative for chest pain and leg swelling.  Gastrointestinal: Negative for abdominal pain, constipation, diarrhea, heartburn, melena, nausea and vomiting.  Genitourinary: Negative for dysuria and hematuria.  Musculoskeletal: Negative for falls, joint pain and  myalgias.  Neurological: Negative for dizziness, loss of consciousness and weakness.  Psychiatric/Behavioral: Negative for substance abuse.     Physical Exam:  Vitals:   12/22/16 1533  BP: 124/81  Pulse: 79  Temp: 98 F (36.7 C)  TempSrc: Oral  SpO2: 98%  Weight: 140 lb (63.5 kg)   Physical Exam  Constitutional: He is oriented to person, place, and time. He appears well-developed and well-nourished. No distress.  HENT:  Head: Normocephalic and atraumatic.  Cardiovascular: Normal rate and regular rhythm.   No murmur heard. Pulmonary/Chest: Effort normal and breath sounds normal. No respiratory distress. He has no wheezes. He has no rales.  Abdominal: Soft. He exhibits no distension. There is no tenderness. There is no guarding.  Genitourinary: Rectum normal. Rectal exam shows no external hemorrhoid, no internal hemorrhoid, no fissure, no mass, no tenderness and anal tone normal.  Musculoskeletal: Normal range of motion. He exhibits no edema.  Neurological: He is alert and oriented to person, place, and time.    Assessment & Plan:   See Encounters Tab for problem based charting.  Patient discussed with Dr. Dareen Piano

## 2016-12-22 NOTE — Patient Instructions (Addendum)
It was a pleasure to meet you Caleb Bryan.  I did not see or feel any hemorrhoids today, but your internal hemorrhoids may still be present.  You can try Preparation H cream twice a day for 7 days if you are having any itching or pain.  You can also try sitz baths.  Please work with our Development worker, community to obtain the orange card or other form of insurance. Once obtained, I will recommend that you go for a screening colonoscopy.  Please follow up with me in 1 year or sooner if needed.    Hemorroides (Hemorrhoids) Las hemorroides son venas inflamadas adentro o alrededor del recto o del ano. Hay dos tipos de hemorroides:  Hemorroides internas. Se forman en las venas del interior del recto. Pueden abultarse hacia afuera, irritarse y doler.  Hemorroides externas. Se producen en las venas externas del ano y pueden sentirse como un bulto o zona hinchada, dura y dolorosa cerca del ano. [place] hemorroides no causan problemas graves y se Engineer, petroleum con tratamientos caseros [employer] cambios en la dieta y el estilo de vida. Si los tratamientos caseros no ayudan con los sntomas, se pueden Optometrist procedimientos para reducir o extirpar las hemorroides. CAUSAS La causa de esta afeccin es el aumento de la presin en la zona anal. Esta presin puede ser causada por distintos factores, por ejemplo:  Estreimiento.  Dificultad para defecar.  Diarrea.  Embarazo.  Obesidad.  Estar sentado durante largos perodos.  Levantar objetos pesados u otras actividades que impliquen esfuerzo.  Sexo anal. SNTOMAS Los sntomas de esta afeccin incluyen lo siguiente:  Dolor.  Picazn o irritacin anal.  Sangrado rectal.  Prdida de materia fecal (heces).  Inflamacin anal.  Uno o ms bultos alrededor del ano. DIAGNSTICO Esta afeccin se diagnostica frecuentemente a travs de un examen visual. Posiblemente le realicen otros tipos de pruebas o estudios, como los  siguientes:  Examen de la zona rectal con una mano enguantada (examen digital rectal).  Examen del canal anal utilizando un pequeo tubo (anoscopio).  Anlisis de sangre si ha perdido [place] cantidad significativa de [place].  Un estudio para observar el interior del colon (sigmoidoscopia o colonoscopia). TRATAMIENTO Esta afeccin generalmente se puede tratar en el hogar. Se pueden realizar diversos procedimientos si los cambios en la dieta, en el estilo de vida y otros tratamientos caseros no [employer]. Estos procedimientos pueden ayudar a reducir o [place] hemorroides completamente. Algunos de estos procedimientos son quirrgicos y otros no. Algunos de los procedimientos ms frecuentes son los siguientes:  Ligadura con Forensic psychologist. Las bandas elsticas se colocan en la base de las hemorroides para interrumpir la irrigacin de [place].  Escleroterapia. Se inyecta un medicamento en las hemorroides para reducir su tamao.  Coagulacin con luz infrarroja. Se utiliza un tipo de energa lumnica para eliminar las hemorroides.  Hemorroidectoma. Las hemorroides se extirpan con [place] y las venas que las [place] se IT consultant.  Hemorroidopexia con grapas. [place] un dispositivo tipo grapa de forma circular para extirpar las hemorroides y unas grapas para cortar la sangre que se irriga hacia las hemorroides. INSTRUCCIONES PARA EL CUIDADO EN EL HOGAR Comida y bebida   Consuma alimentos con alto contenido de [place], como cereales integrales, porotos, frutos secos, frutas y verduras. Pregntele a su mdico acerca de tomar productos con fibra aadida en ellos (complementos de fibra).  Beba suficiente lquido para Consulting civil engineer orina clara o de color amarillo plido. Control del dolor y la hinchazn  Tome baos de asiento tibios durante 20 minutos, 3 o 4 veces por da para Glass blower/designer y las [place].  Si se lo indican, aplique hielo en la zona afectada. Usar compresas de [employer]  baos de asiento puede ser [place].  Ponga el hielo en una bolsa plstica.  Coloque una toalla entre la piel y la bolsa de hielo.  Coloque el hielo durante 20 minutos, 2 a 3 veces por da. Instrucciones generales   [employer] de venta libre y los recetados solamente como se lo haya indicado el mdico.  Aplquese los medicamentos, cremas o supositorios como se lo hayan indicado.  Haga ejercicios regularmente.  Vaya al bao cuando sienta la necesidad de defecar. No espere.  Evite hacer fuerza al defecar.  Mantenga la zona anal limpia y seca. Use papel higinico hmedo o toallitas humedecidas despus de defecar.  No pase mucho tiempo sentado en el inodoro. Esto aumenta la afluencia de sangre y Conservation officer, historic buildings. SOLICITE ATENCIN MDICA SI:  Aumenta el dolor y la hinchazn, y no puede controlarlos con los medicamentos o con Lexicographer.  Tiene una hemorragia que no IT consultant.  No puede defecar o lo hace con dificultad.  Siente dolor o tiene inflamacin fuera de la zona de las hemorroides. Esta informacin no tiene Marine scientist el consejo del mdico. Asegrese de hacerle al mdico cualquier pregunta que tenga. Document Released: 10/25/2005 Document Revised: 02/16/2016 Document Reviewed: 07/09/2015 Elsevier Interactive Patient Education  2017 [place] tomar un bao de asiento (How to Take a [employer]) Un bao se asiento es un bao de agua tibia que se toma mientras se est sentado. El agua solo debe Systems analyst las caderas y cubrir las nalgas. El mdico puede recomendar un bao de asiento para ayudarlo con lo siguiente:  Higienizar la parte baja del cuerpo, incluida la zona genital.  La picazn.  El dolor.  Los [employer] o los msculos que se contraen o se Proofreader. [place] 3 o 4baos de asiento diarios o como se lo haya indicado el mdico. 1. Llene parte de la baera con agua tibia. Solo necesitar  que el agua tenga la profundidad suficiente para cubrirle las caderas y las nalgas cuando est sentado en ella. 2. Si el mdico le indic que ponga medicamentos en el agua, siga las indicaciones al pie de la [place]. 3. Sintese en el agua y abra un poco el drenaje de la baera. 4. Sherlon Handing el agua tibia nuevamente para mantener la baera en Teacher, English as a foreign language. Deje correr el agua constantemente. 5. Sumrjase en el agua durante 15 o 28minutos, o como se lo haya indicado el mdico. 6. Despus de tomar el bao de asiento, seque primero la zona afectada con golpecitos suaves. No la frote. 7. Tenga cuidado al pararse despus de tomar un bao de asiento ya que [place]. SOLICITE ATENCIN MDICA SI:  Los sntomas empeoran. No contine con los baos de asiento si los sntomas empeoran.  Aparecen nuevos sntomas. No contine con los baos de asiento hasta que hable con el mdico. Esta informacin no tiene Marine scientist el consejo del mdico. Asegrese de hacerle al mdico cualquier pregunta que tenga. Document Released: 12/06/2006 Document Revised: 03/11/2015 Document Reviewed: 10/23/2014 Elsevier Interactive Patient Education  2017 [employer].

## 2016-12-24 ENCOUNTER — Encounter: Payer: Self-pay | Admitting: Internal Medicine

## 2016-12-24 NOTE — Assessment & Plan Note (Signed)
Patient reports seeing three doctors in the past with the third doctor diagnosing him with internal hemorrhoids. Patient denies any BRBPR, pruritus, or pain with defecation. He sometimes sees spots of blood on toilet paper after wiping. He has noticed seeing small "flesh balls" on occasion when he strains to defecate. He denies seeing any blood clots. He reports having excess flatulence. He denies any diarrhea, constipation, melena. He does not take any medications.  Patient with reported history of internal hemorrhoids. No external or internal hemorrhoids appreciated on exam this visit, although he may have more distal internal hemorrhoids. Patient will try Preparation H and Sitz baths as needed. I am unsure what to make of patient's "flesh balls" that he sees on straining defecation. He is due for screening colonoscopy and once he renews his orange card I will refer him to have this done.

## 2016-12-29 NOTE — Progress Notes (Signed)
Internal Medicine Clinic Attending  Case discussed with Dr. Patel,Vishal at the time of the visit.  We reviewed the resident's history and exam and pertinent patient test results.  I agree with the assessment, diagnosis, and plan of care documented in the resident's note.  

## 2017-01-27 ENCOUNTER — Ambulatory Visit: Payer: Self-pay

## 2017-01-28 ENCOUNTER — Ambulatory Visit: Payer: Self-pay

## 2017-03-15 ENCOUNTER — Ambulatory Visit (INDEPENDENT_AMBULATORY_CARE_PROVIDER_SITE_OTHER): Payer: Self-pay | Admitting: Internal Medicine

## 2017-03-15 VITALS — BP 112/72 | HR 87 | Temp 98.6°F | Ht 65.0 in | Wt 139.9 lb

## 2017-03-15 DIAGNOSIS — Z8349 Family history of other endocrine, nutritional and metabolic diseases: Secondary | ICD-10-CM

## 2017-03-15 DIAGNOSIS — K0889 Other specified disorders of teeth and supporting structures: Secondary | ICD-10-CM

## 2017-03-15 DIAGNOSIS — R351 Nocturia: Secondary | ICD-10-CM

## 2017-03-15 DIAGNOSIS — Z87891 Personal history of nicotine dependence: Secondary | ICD-10-CM

## 2017-03-15 DIAGNOSIS — K089 Disorder of teeth and supporting structures, unspecified: Principal | ICD-10-CM

## 2017-03-15 DIAGNOSIS — Z0000 Encounter for general adult medical examination without abnormal findings: Secondary | ICD-10-CM | POA: Insufficient documentation

## 2017-03-15 DIAGNOSIS — K648 Other hemorrhoids: Secondary | ICD-10-CM

## 2017-03-15 DIAGNOSIS — G8929 Other chronic pain: Secondary | ICD-10-CM

## 2017-03-15 DIAGNOSIS — Z833 Family history of diabetes mellitus: Secondary | ICD-10-CM

## 2017-03-15 NOTE — Assessment & Plan Note (Addendum)
Patient endorsing nocturia. He is awoken 2-3 times per night to urinate. He says everyone in his family has diabetes.Could be 2/2 infection vs DM. -- BMP -- A1c -- Urinalysis

## 2017-03-15 NOTE — Patient Instructions (Signed)
It was a pleasure seeing you today. Thank you for choosing [facility] for your healthcare needs.   I have made a referral for you to be seen by a dentist.  I made a referral for you to have a colonoscopy.  We will get blood work and a urine sample for me today. I will call you with the results of these studies.   Please return to clinic in 3 months or sooner as needed.

## 2017-03-15 NOTE — Assessment & Plan Note (Signed)
The patient complains of dental pain for several months. He has pain to cold, heat and mechanical stimuli. Pain is most severe on the right lower mandible. Oral examination reveals poor oral dentition with several dental caries noted. He has ongoing pain from this issue. He'll benefit from referral to dentistry. -- Refer to dentistry

## 2017-03-15 NOTE — Assessment & Plan Note (Signed)
Patient complains of seeing "a flesh ball" protruding from his rectum every time he defecates. Physical examination by Dr. Posey Pronto showed no external or internal hemorrhoids. It is unclear what this sensation is he is describing. He also complains of intermittent crampy abdominal pain. It may represent a more internal hemorrhoid which would need evaluation with colonoscopy. We'll refer to GI. -- Refer to GI for colonoscopy

## 2017-03-15 NOTE — Progress Notes (Signed)
   CC: Dental pain HPI: Mr. Caleb Bryan is a 51 y.o. male with a h/o of internal hemorrhoids who presents with dental pain and nocturia.   Review of Systems: Denies CP and SOB. Denies n/v. Endorses nocturia.   Physical Exam: Vitals:   03/15/17 1505  BP: 112/72  Pulse: 87  Temp: 98.6 F (37 C)  TempSrc: Oral  SpO2: 99%  Weight: 139 lb 14.4 oz (63.5 kg)  Height: 5\' 5"  (1.651 m)   BP 112/72 (BP Location: Left Arm, Patient Position: Sitting, Cuff Size: Normal)   Pulse 87   Temp 98.6 F (37 C) (Oral)   Ht 5\' 5"  (1.651 m)   Wt 139 lb 14.4 oz (63.5 kg)   SpO2 99%   BMI 23.28 kg/m  Head: Normocephalic, without obvious abnormality, atraumatic Heart: regular rate and rhythm, S1, S2 normal, no murmur, click, rub or gallop Abdomen: soft, non-tender; bowel sounds normal; no masses,  no organomegaly Extremities: extremities normal, atraumatic, no cyanosis or edema Patient has multiple dental carries on the right lower teeth.   Assessment & Plan:  See encounters tab for problem based medical decision making. Patient discussed with Dr. Angelia Mould  Signed: Ophelia Shoulder, MD 03/15/2017, 3:27 PM  Pager: [phone]

## 2017-03-15 NOTE — Assessment & Plan Note (Addendum)
Will refer for colonoscopy. Additionally, patient's states he has a strong family history of diabetes and high cholesterol. -- Screening and diagnostic colonoscopy because of internal hemorrhoids -- A1c -- Lipid profile

## 2017-03-16 ENCOUNTER — Telehealth: Payer: Self-pay | Admitting: Internal Medicine

## 2017-03-16 LAB — URINALYSIS, COMPLETE
Bilirubin, UA: NEGATIVE
GLUCOSE, UA: NEGATIVE
KETONES UA: NEGATIVE
Leukocytes, UA: NEGATIVE
NITRITE UA: NEGATIVE
PROTEIN UA: NEGATIVE
RBC UA: NEGATIVE
UUROB: 0.2 mg/dL (ref 0.2–1.0)
pH, UA: 5.5 (ref 5.0–7.5)

## 2017-03-16 LAB — MICROSCOPIC EXAMINATION
BACTERIA UA: NONE SEEN
Casts: NONE SEEN /lpf
Epithelial Cells (non renal): NONE SEEN /hpf (ref 0–10)

## 2017-03-16 LAB — LIPID PANEL
CHOL/HDL RATIO: 3.6 ratio (ref 0.0–5.0)
Cholesterol, Total: 181 mg/dL (ref 100–199)
HDL: 50 mg/dL (ref >39)
LDL Calculated: 102 mg/dL — ABNORMAL HIGH (ref 0–99)
TRIGLYCERIDES: 143 mg/dL (ref 0–149)
VLDL Cholesterol Cal: 29 mg/dL (ref 5–40)

## 2017-03-16 LAB — BMP8+ANION GAP
ANION GAP: 12 mmol/L (ref 10.0–18.0)
BUN/Creatinine Ratio: 20 (ref 9–20)
BUN: 20 mg/dL (ref 6–24)
CO2: 26 mmol/L (ref 18–29)
Calcium: 8.8 mg/dL (ref 8.7–10.2)
Chloride: 100 mmol/L (ref 96–106)
Creatinine, Ser: 0.99 mg/dL (ref 0.76–1.27)
GFR, EST AFRICAN AMERICAN: 101 mL/min/{1.73_m2} (ref >59)
GFR, EST NON AFRICAN AMERICAN: 87 mL/min/{1.73_m2} (ref >59)
Glucose: 99 mg/dL (ref 65–99)
Potassium: 4.3 mmol/L (ref 3.5–5.2)
SODIUM: 138 mmol/L (ref 134–144)

## 2017-03-16 LAB — HEMOGLOBIN A1C
Est. average glucose Bld gHb Est-mCnc: 114 mg/dL
HEMOGLOBIN A1C: 5.6 % (ref 4.8–5.6)

## 2017-03-16 NOTE — Telephone Encounter (Signed)
Patient informed that his lab results were normal. He had no additional questions. His LDL cholesterol is slightly elevated. I do not think he needs a statin at this time. However, at his next visit please discuss diet and exercise and other lifestyle modifications with the patient.

## 2017-03-22 NOTE — Progress Notes (Signed)
Internal Medicine Clinic Attending  Case discussed with Dr. Taylor at the time of the visit.  We reviewed the resident's history and exam and pertinent patient test results.  I agree with the assessment, diagnosis, and plan of care documented in the resident's note. 

## 2017-04-05 ENCOUNTER — Other Ambulatory Visit: Payer: Self-pay | Admitting: Gastroenterology

## 2017-04-21 ENCOUNTER — Ambulatory Visit (HOSPITAL_COMMUNITY)
Admission: RE | Admit: 2017-04-21 | Discharge: 2017-04-21 | Disposition: A | Discharge status: Home / Self Care | Payer: Self-pay | Source: Ambulatory Visit | Attending: Gastroenterology | Admitting: Gastroenterology

## 2017-04-21 ENCOUNTER — Encounter (HOSPITAL_COMMUNITY): Payer: Self-pay

## 2017-04-21 ENCOUNTER — Ambulatory Visit (HOSPITAL_COMMUNITY): Payer: Self-pay | Admitting: Certified Registered Nurse Anesthetist

## 2017-04-21 ENCOUNTER — Encounter (HOSPITAL_COMMUNITY)
Admission: RE | Disposition: A | Discharge status: Home / Self Care | Payer: Self-pay | Source: Ambulatory Visit | Attending: Gastroenterology

## 2017-04-21 DIAGNOSIS — K625 Hemorrhage of anus and rectum: Secondary | ICD-10-CM | POA: Insufficient documentation

## 2017-04-21 DIAGNOSIS — Z87891 Personal history of nicotine dependence: Secondary | ICD-10-CM | POA: Insufficient documentation

## 2017-04-21 DIAGNOSIS — D128 Benign neoplasm of rectum: Secondary | ICD-10-CM | POA: Insufficient documentation

## 2017-04-21 HISTORY — DX: Personal history of urinary calculi: Z87.442

## 2017-04-21 HISTORY — PX: COLONOSCOPY WITH PROPOFOL: SHX5780

## 2017-04-21 SURGERY — COLONOSCOPY WITH PROPOFOL
Anesthesia: Monitor Anesthesia Care

## 2017-04-21 MED ORDER — PROPOFOL 500 MG/50ML IV EMUL
INTRAVENOUS | Status: DC | PRN
  Administered 2017-04-21: 50 ug/kg/min via INTRAVENOUS

## 2017-04-21 MED ORDER — PROPOFOL 10 MG/ML IV BOLUS
INTRAVENOUS | Status: DC | PRN
  Administered 2017-04-21 (×3): 20 mg via INTRAVENOUS
  Administered 2017-04-21: 40 mg via INTRAVENOUS

## 2017-04-21 MED ORDER — LACTATED RINGERS IV SOLN
INTRAVENOUS | Status: DC
  Administered 2017-04-21: 12:00:00 via INTRAVENOUS

## 2017-04-21 SURGICAL SUPPLY — 22 items

## 2017-04-21 NOTE — Transfer of Care (Signed)
Immediate Anesthesia Transfer of Care Note  Patient: Caleb Bryan  Procedure(s) Performed: Procedure(s): COLONOSCOPY WITH PROPOFOL (N/A)  Patient Location: Endoscopy Unit  Anesthesia Type:MAC  Level of Consciousness: awake, alert , oriented and patient cooperative  Airway & Oxygen Therapy: Patient Spontanous Breathing  Post-op Assessment: Report given to RN, Post -op Vital signs reviewed and stable and Patient moving all extremities X 4  Post vital signs: Reviewed and stable  Last Vitals:  Vitals:   04/21/17 1152  BP: 109/73  Pulse: 78  Resp: 16  Temp: 36.9 C    Last Pain:  Vitals:   04/21/17 1152  TempSrc: Oral         Complications: No apparent anesthesia complications

## 2017-04-21 NOTE — Discharge Instructions (Addendum)
Colonoscopia en los adultos °(Colonoscopy, Adult) °Una colonoscopia es un examen que se realiza para examinar el intestino grueso. Se hace para comprobar si hay problemas, por ejemplo: °· Bultos (tumores). °· Crecimientos (pólipos). °· Hinchazón (inflamación). °· Una hemorragia. °ANTES DEL PROCEDIMIENTO °Comida y bebida °Siga las indicaciones del médico respecto de las comidas y las bebidas. Estas indicaciones pueden incluir lo siguiente: °· Unos días antes del procedimiento, siga una dieta con bajo contenido de fibra. °? No coma frutos secos. °? No coma semillas. °? No coma frutas disecadas. °? No coma frutas crudas. °? No coma verduras. °· Siga una dieta de líquidos transparentes durante 1 a 3 días antes del procedimiento. No beba líquidos con colorante rojo o morado. Beba solamente líquidos transparentes, por ejemplo: °? Caldos o sopas transparentes. °? Café negro o té. °? Jugos transparentes. °? Refrescos o bebidas deportivas transparentes. °? Gelatina. °? Helados de agua. °· El día del procedimiento, no coma ni beba nada durante las 2 horas previas al procedimiento. °Preparado intestinal °Si le recetaron un preparado intestinal por vía oral: °· Tómelo como se lo haya indicado el médico. A partir del día previo al procedimiento, tendrá que tomar una gran cantidad de líquido. El líquido lo hará defecar hasta que la materia fecal sea casi transparente o de color verdoso claro. °· Si la piel o la zona anal se le irritan debido a la diarrea, puede hacer lo siguiente: °? Limpie la zona con toallitas que contengan productos medicinales, por ejemplo, toallitas húmedas para adultos con aloe y vitamina E. °? Aplíquese un producto en la piel que suavice la zona, como vaselina. °· Si vomita mientras toma el preparado intestinal, descanse durante un máximo de 60 minutos. Luego comience a tomar el preparado nuevamente. Si sigue vomitando y no puede tomar el preparado intestinal sin vomitar, llame al médico. °Instrucciones  generales °· Consulte al médico si debe cambiar o suspender sus medicamentos habituales. Esto es importante si toma medicamentos para la diabetes o anticoagulantes. °· Haga planes para que una persona lo lleve a su casa desde el hospital o la clínica. °PROCEDIMIENTO °· Pueden colocarle una vía intravenosa (IV) en una de las venas. °· Recibirá un medicamento como ayuda para relajarse (sedante). °· Para reducir el riesgo de infecciones: °? Los médicos se lavarán las manos. °? Le lavarán la zona anal con jabón. °· Le pedirán que se recueste de costado con las rodillas flexionadas. °· El médico tendrá listo un tubo largo, delgado y flexible. El tubo tendrá una cámara y una luz en el extremo. °· Le colocarán el tubo en el ano. °· El tubo se introducirá suavemente en el intestino grueso. °· Le pondrán aire en el intestino grueso para mantenerlo abierto. Es posible que sienta presión o cólicos. °· La cámara se usará para tomar fotografías. °· Pueden extraerle una pequeña muestra de tejido del cuerpo para estudiarla con un microscopio (biopsia). Si se detecta la presencia de posibles problemas, se enviará el tejido a un laboratorio para ser analizado. °· Si se encuentran pequeños crecimientos, el médico puede extirparlos y analizarlos para detectar la presencia de cáncer. °· Lentamente se retirará el tubo que le colocaron en el ano. °Este procedimiento puede variar según el médico y el hospital. °DESPUÉS DEL PROCEDIMIENTO °· El médico lo controlará con frecuencia hasta que hayan desaparecido los efectos de los medicamentos administrados. °· No conduzca durante 24 horas después del procedimiento. °· Es posible que encuentre una pequeña cantidad de sangre en la materia fecal. °· Puede eliminar gases. °· Puede sentir   cólicos o meteorismo leves en el abdomen. °· Depende de usted retirar los resultados del procedimiento. Pregúntele al médico o consulte en el departamento que realiza el procedimiento cuándo estarán los  resultados. ° °Esta información no tiene como fin reemplazar el consejo del médico. Asegúrese de hacerle al médico cualquier pregunta que tenga. °Document Released: 02/09/2011 Document Revised: 10/30/2013 Document Reviewed: 01/06/2016 °Elsevier Interactive Patient Education © 2017 Elsevier Inc. ° °

## 2017-04-21 NOTE — Op Note (Signed)
[facility] Patient Name: Caleb Bryan Procedure Date : 04/21/2017 MRN: [id number] Attending MD: Wonda Horner , MD Date of Birth: [birth year] CSN: [id number] Age: 51 Admit Type: Outpatient Procedure:                Colonoscopy Indications:              Rectal bleeding Providers:                Wonda Horner, MD, Carmie End, RN, Marcene Duos, Technician Referring MD:              Medicines:                Propofol per Anesthesia Complications:            No immediate complications. Estimated Blood Loss:     Estimated blood loss was minimal. Procedure:                Pre-Anesthesia Assessment:                           - Prior to the procedure, a History and Physical                            was performed, and patient medications and                            allergies were reviewed. The patient's tolerance of                            previous anesthesia was also reviewed. The risks                            and benefits of the procedure and the sedation                            options and risks were discussed with the patient.                            All questions were answered, and informed consent                            was obtained. Prior Anticoagulants: The patient has                            taken no previous anticoagulant or antiplatelet                            agents. ASA Grade Assessment: I - A normal, healthy                            patient. After reviewing the risks and benefits,  the patient was deemed in satisfactory condition to                            undergo the procedure.                           After obtaining informed consent, the colonoscope                            was passed under direct vision. Throughout the                            procedure, the patient's blood pressure, pulse, and                            oxygen saturations were monitored  continuously. The                            EC-[id number] ([id number]) scope was introduced through                            the anus and advanced to the the cecum, identified                            by appendiceal orifice and ileocecal valve. The                            ileocecal valve, appendiceal orifice, and rectum                            were photographed. The colonoscopy was performed                            without difficulty. The patient tolerated the                            procedure well. The quality of the bowel                            preparation was good. Scope In: 1:40:43 PM Scope Out: 1:57:22 PM Scope Withdrawal Time: 0 hours 9 minutes 14 seconds  Total Procedure Duration: 0 hours 16 minutes 39 seconds  Findings:      The digital rectal exam revealed a rectal mass. There was soft       granularity circumferentially distally and aroung the aral canal. The       bulk of the mass was felt on the left rectal wall distally.      A large polyp was found in the rectum. Biopsies were taken with a cold       forceps for histology. This polyp was circumferential around the anal       area and extended proximally      The exam was otherwise without abnormality. Impression:               - Rectal mass.                           -  One large polyp in the rectum. Biopsied.                           - The examination was otherwise normal. Moderate Sedation:      . Recommendation:           - Resume regular diet.                           - Continue present medications.                           - Await pathology results.                           - Surgical referral for transanal excision.                           - Repeat colonoscopy is recommended. The                            colonoscopy date will be determined after pathology                            results from today's exam become available for                            review. Procedure Code(s):        ---  Professional ---                           [phone], Colonoscopy, flexible; with biopsy, single                            or multiple Diagnosis Code(s):        --- Professional ---                           K62.89, Other specified diseases of anus and rectum                           K62.1, Rectal polyp                           K62.5, Hemorrhage of anus and rectum CPT copyright 2016 American Medical Association. All rights reserved. The codes documented in this report are preliminary and upon coder review may  be revised to meet current compliance requirements. Wonda Horner, MD 04/21/2017 2:12:03 PM This report has been signed electronically. Number of Addenda: 0

## 2017-04-21 NOTE — Anesthesia Procedure Notes (Signed)
Procedure Name: MAC Date/Time: 04/21/2017 1:33 PM Performed by: Mervyn Gay Pre-anesthesia Checklist: Patient identified, Patient being monitored, Timeout performed, Emergency Drugs available and Suction available Patient Re-evaluated:Patient Re-evaluated prior to inductionOxygen Delivery Method: Simple face mask Number of attempts: 1 Placement Confirmation: positive ETCO2 Dental Injury: Teeth and Oropharynx as per pre-operative assessment

## 2017-04-21 NOTE — Anesthesia Preprocedure Evaluation (Signed)
Anesthesia Evaluation  Patient identified by MRN, date of birth, ID band Patient awake    Reviewed: Allergy & Precautions, NPO status , Patient's Chart, lab work & pertinent test results  History of Anesthesia Complications Negative for: history of anesthetic complications  Airway Mallampati: I   Neck ROM: Full    Dental  (+) Teeth Intact, Dental Advisory Given   Pulmonary neg pulmonary ROS, former smoker,    breath sounds clear to auscultation       Cardiovascular Exercise Tolerance: Good negative cardio ROS   Rhythm:Regular Rate:Normal     Neuro/Psych negative neurological ROS  negative psych ROS   GI/Hepatic negative GI ROS, Neg liver ROS,   Endo/Other  negative endocrine ROS  Renal/GU      Musculoskeletal negative musculoskeletal ROS (+)   Abdominal (+)  Abdomen: soft. Bowel sounds: normal.  Peds  Hematology negative hematology ROS (+)   Anesthesia Other Findings   Reproductive/Obstetrics                             Anesthesia Physical Anesthesia Plan  ASA: I  Anesthesia Plan: MAC   Post-op Pain Management:    Induction: Intravenous  PONV Risk Score and Plan:   Airway Management Planned: Mask and Natural Airway  Additional Equipment:   Intra-op Plan:   Post-operative Plan:   Informed Consent: I have reviewed the patients History and Physical, chart, labs and discussed the procedure including the risks, benefits and alternatives for the proposed anesthesia with the patient or authorized representative who has indicated his/her understanding and acceptance.   Dental advisory given  Plan Discussed with: CRNA, Anesthesiologist and Surgeon  Anesthesia Plan Comments:         Anesthesia Quick Evaluation

## 2017-04-21 NOTE — Anesthesia Postprocedure Evaluation (Addendum)
Anesthesia Post Note  Patient: Caleb Bryan  Procedure(s) Performed: Procedure(s) (LRB): COLONOSCOPY WITH PROPOFOL (N/A)     Patient location during evaluation: Endoscopy Anesthesia Type: MAC Level of consciousness: awake and alert Pain management: pain level controlled Vital Signs Assessment: post-procedure vital signs reviewed and stable Respiratory status: spontaneous breathing, nonlabored ventilation, respiratory function stable and patient connected to nasal cannula oxygen Cardiovascular status: stable and blood pressure returned to baseline Anesthetic complications: no    Last Vitals:  Vitals:   04/21/17 1405 04/21/17 1415  BP: 105/72 107/74  Pulse: 81 80  Resp: 18 20  Temp: 36.6 C     Last Pain:  Vitals:   04/21/17 1405  TempSrc: Oral                 Natilee Gauer

## 2017-04-21 NOTE — H&P (Signed)
The patient is a 51 year old male who presents to the endoscopy unit today for a colonoscopy because of rectal bleeding. He is here with his son who translates. Patient speaks some [place] but mostly [place]. States his preparation went well.  Physical  No distress  Heart regular rhythm no murmurs  Lungs clear  Abdomen soft and nontender  Impression rectal bleeding  Plan colonoscopy

## 2017-04-23 ENCOUNTER — Encounter (HOSPITAL_COMMUNITY): Payer: Self-pay | Admitting: Gastroenterology

## 2017-05-05 ENCOUNTER — Telehealth: Payer: Self-pay | Admitting: Internal Medicine

## 2017-05-05 DIAGNOSIS — D128 Benign neoplasm of rectum: Secondary | ICD-10-CM

## 2017-05-05 NOTE — Telephone Encounter (Signed)
Rec'd Call from [place] @ [place] requesting a ref to [facility] for DX Rectal Polyp for Dr. Leighton Ruff.  Precords have been placed in your box for review.  Please Advise.

## 2017-05-17 DIAGNOSIS — D128 Benign neoplasm of rectum: Secondary | ICD-10-CM | POA: Insufficient documentation

## 2017-05-17 NOTE — Telephone Encounter (Signed)
I have placed a request for referral to [place] [facility]

## 2017-05-31 ENCOUNTER — Ambulatory Visit (INDEPENDENT_AMBULATORY_CARE_PROVIDER_SITE_OTHER): Payer: Self-pay | Admitting: Internal Medicine

## 2017-05-31 ENCOUNTER — Encounter: Payer: Self-pay | Admitting: Internal Medicine

## 2017-05-31 VITALS — BP 109/67 | HR 75 | Temp 98.4°F | Wt 141.2 lb

## 2017-05-31 DIAGNOSIS — Z23 Encounter for immunization: Secondary | ICD-10-CM

## 2017-05-31 DIAGNOSIS — D128 Benign neoplasm of rectum: Secondary | ICD-10-CM

## 2017-05-31 NOTE — Patient Instructions (Signed)
Mr. Quevedo,  Por favor mantenga tu cita el lunes, 7/30 con el cirujano.  Por favor llame a la clinica si tienes preguntas.

## 2017-05-31 NOTE — Progress Notes (Signed)
   CC: Follow up on rectal mass  HPI:  [name] is a 51 y.o. male with history noted below that presents to the acute care clinic for follow up on rectal mass that was identified on diagnositic colonoscopy on 04/21/17.  Patient states that he was seen in clinic in may for complaints of a ball protruding from his rectum noticed when he has a bowel movement.  He also reports continued intermittent abdominal cramps with flatulence.  He had a colonoscopy in June and told to follow up in the internal medicine clinic to schedule an appointment with general surgery.  Past Medical History:  Diagnosis Date  . History of kidney stones    2016  . Internal hemorrhoids without complication     Review of Systems:  Review of Systems  Respiratory: Negative for shortness of breath.   Cardiovascular: Negative for chest pain.  Gastrointestinal: Positive for abdominal pain and blood in stool. Negative for nausea and vomiting.     Physical Exam:  Vitals:   05/31/17 1615  BP: 109/67  Pulse: 75  Temp: 98.4 F (36.9 C)  TempSrc: Oral  SpO2: 99%  Weight: 141 lb 3.2 oz (64 kg)   Physical Exam  Constitutional: He is well-developed, well-nourished, and in no distress.  Cardiovascular: Normal rate, regular rhythm and normal heart sounds.  Exam reveals no gallop and no friction rub.   No murmur heard. Pulmonary/Chest: Effort normal and breath sounds normal. No respiratory distress. He has no wheezes. He has no rales.  Abdominal: Soft. He exhibits no distension. There is no tenderness. There is no rebound and no guarding.    Assessment & Plan:   See encounters tab for problem based medical decision making.    Patient discussed with Dr. Daryll Drown

## 2017-06-05 DIAGNOSIS — Z23 Encounter for immunization: Secondary | ICD-10-CM | POA: Insufficient documentation

## 2017-06-05 NOTE — Assessment & Plan Note (Signed)
Patient is due for tetanus vaccine.  In office Tdap was given

## 2017-06-05 NOTE — Assessment & Plan Note (Addendum)
Assessment:  Rectal mass Patient had a diagnostic colonoscopy on 6/14 after he reported a ball like mass protruding from his rectum when seen in clinic visit 5/8.  Colonoscopy confirmed a rectal mass and recommendations included surgical referral for transanal excision. 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 Patient states that he has an appointment scheduled with general surgery to discuss options for his findings.  This was confirmed while patient was in office  Plan -patient has scheduled appointment with general surgery on 7/30

## 2017-06-06 ENCOUNTER — Other Ambulatory Visit: Payer: Self-pay | Admitting: General Surgery

## 2017-06-06 NOTE — H&P (Addendum)
History of Present Illness Leighton Ruff MD; 12/10/5425 3:34 PM) The patient is a 51 year old male who presents with a colorectal polyp. 51 year old male who presents to the office for evaluation of a distal rectal polyp. This was seen on a colonoscopy performed by Dr. Penelope Coop. The patient has noted some rectal bleeding with wiping over the past year. He denies any pain. He denies any difficulty with bowel movements. He has never had any perianal procedures in the past.   Past Surgical History Mammie Lorenzo, LPN; 0/62/3762 8:31 PM) Colon Polyp Removal - Colonoscopy  Diagnostic Studies History Mammie Lorenzo, LPN; 03/25/6159 7:37 PM) Colonoscopy within last year  Allergies Mammie Lorenzo, LPN; 11/13/2692 8:54 PM) No Known Allergies 06/06/2017 Allergies Reconciled  Medication History Mammie Lorenzo, LPN; 05/04/349 0:93 PM) No Current Medications Medications Reconciled  Social History Mammie Lorenzo, LPN; 06/25/2992 7:16 PM) Alcohol use Occasional alcohol use. Caffeine use Carbonated beverages, Coffee. No drug use Tobacco use Former smoker.  Family History Mammie Lorenzo, LPN; 9/67/8938 1:01 PM) Diabetes Mellitus Father.     Review of Systems Claiborne Billings Dockery LPN; 7/51/0258 5:27 PM) General Not Present- Appetite Loss, Chills, Fatigue, Fever, Night Sweats, Weight Gain and Weight Loss. Skin Not Present- Change in Wart/Mole, Dryness, Hives, Jaundice, New Lesions, Non-Healing Wounds, Rash and Ulcer. HEENT Present- Wears glasses/contact lenses. Not Present- Earache, Hearing Loss, Hoarseness, Nose Bleed, Oral Ulcers, Ringing in the Ears, Seasonal Allergies, Sinus Pain, Sore Throat, Visual Disturbances and Yellow Eyes. Respiratory Not Present- Bloody sputum, Chronic Cough, Difficulty Breathing, Snoring and Wheezing. Breast Not Present- Breast Mass, Breast Pain, Nipple Discharge and Skin Changes. Cardiovascular Not Present- Chest Pain, Difficulty Breathing Lying Down, Leg  Cramps, Palpitations, Rapid Heart Rate, Shortness of Breath and Swelling of Extremities. Gastrointestinal Not Present- Abdominal Pain, Bloating, Bloody Stool, Change in Bowel Habits, Chronic diarrhea, Constipation, Difficulty Swallowing, Excessive gas, Gets full quickly at meals, Hemorrhoids, Indigestion, Nausea, Rectal Pain and Vomiting. Male Genitourinary Not Present- Blood in Urine, Change in Urinary Stream, Frequency, Impotence, Nocturia, Painful Urination, Urgency and Urine Leakage. Musculoskeletal Not Present- Back Pain, Joint Pain, Joint Stiffness, Muscle Pain, Muscle Weakness and Swelling of Extremities. Neurological Not Present- Decreased Memory, Fainting, Headaches, Numbness, Seizures, Tingling, Tremor, Trouble walking and Weakness. Endocrine Not Present- Cold Intolerance, Excessive Hunger, Hair Changes, Heat Intolerance, Hot flashes and New Diabetes. Hematology Not Present- Blood Thinners, Easy Bruising, Excessive bleeding, Gland problems, HIV and Persistent Infections.  Vitals Claiborne Billings Dockery LPN; 7/82/4235 3:61 PM) 06/06/2017 3:29 PM Weight: 140.8 lb Height: 65in Body Surface Area: 1.7 m Body Mass Index: 23.43 kg/m  Temp.: 97.53F(Oral)  Pulse: 83 (Regular)  BP: 118/70 (Sitting, Left Arm, Standard)      Physical Exam Leighton Ruff MD; 4/43/1540 3:35 PM)  General Mental Status-Alert. General Appearance-Not in acute distress. Build & Nutrition-Well nourished. Posture-Normal posture. Gait-Normal.  Head and Neck Head-normocephalic, atraumatic with no lesions or palpable masses. Trachea-midline.  Chest and Lung Exam Chest and lung exam reveals -on auscultation, normal breath sounds, no adventitious sounds and normal vocal resonance.  Cardiovascular Cardiovascular examination reveals -normal heart sounds, regular rate and rhythm with no murmurs and no digital clubbing, cyanosis, edema, increased warmth or  tenderness.  Abdomen Inspection Inspection of the abdomen reveals - No Hernias. Palpation/Percussion Palpation and Percussion of the abdomen reveal - Soft, Non Tender, No Rigidity (guarding), No hepatosplenomegaly and No Palpable abdominal masses.  Neurologic Neurologic evaluation reveals -alert and oriented x 3 with no impairment of recent or remote memory, normal attention span and  ability to concentrate, normal sensation and normal coordination.  Musculoskeletal Normal Exam - Bilateral-Upper Extremity Strength Normal and Lower Extremity Strength Normal.  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 On exam this appears to be posterior midline. I have recommended a transanal excision of the rectal polyp. I think this can be done without much difficulty or pain to the patient. Risk of surgery include recurrence bleeding and breakdown of the surgical wound.

## 2017-06-07 NOTE — Progress Notes (Signed)
Internal Medicine Clinic Attending  Case discussed with Dr. Hoffman at the time of the visit.  We reviewed the resident's history and exam and pertinent patient test results.  I agree with the assessment, diagnosis, and plan of care documented in the resident's note.  

## 2017-06-08 ENCOUNTER — Encounter (HOSPITAL_BASED_OUTPATIENT_CLINIC_OR_DEPARTMENT_OTHER): Payer: Self-pay | Admitting: *Deleted

## 2017-06-08 NOTE — Progress Notes (Signed)
SP[facility] INTERPRETER # [phone].  NPO AFTER MN W/ EXCEPTION CLEAR LIQUIDS UNTIL 0930 (NO CREAM/ MILK PRODUCTS).  ARRIVE AT 1330.  REQUESTED SPANISH INTERPRETER TO ARRIVE AT 1315 AND WILL PLACE CONFIRMATION ON CHART.

## 2017-06-16 ENCOUNTER — Ambulatory Visit (HOSPITAL_BASED_OUTPATIENT_CLINIC_OR_DEPARTMENT_OTHER): Payer: Self-pay | Admitting: Anesthesiology

## 2017-06-16 ENCOUNTER — Encounter (HOSPITAL_BASED_OUTPATIENT_CLINIC_OR_DEPARTMENT_OTHER): Payer: Self-pay | Admitting: *Deleted

## 2017-06-16 ENCOUNTER — Ambulatory Visit (HOSPITAL_BASED_OUTPATIENT_CLINIC_OR_DEPARTMENT_OTHER)
Admission: RE | Admit: 2017-06-16 | Discharge: 2017-06-16 | Disposition: A | Discharge status: Home / Self Care | Payer: Self-pay | Source: Ambulatory Visit | Attending: General Surgery | Admitting: General Surgery

## 2017-06-16 ENCOUNTER — Encounter (HOSPITAL_BASED_OUTPATIENT_CLINIC_OR_DEPARTMENT_OTHER)
Admission: RE | Disposition: A | Discharge status: Home / Self Care | Payer: Self-pay | Source: Ambulatory Visit | Attending: General Surgery

## 2017-06-16 DIAGNOSIS — Z87891 Personal history of nicotine dependence: Secondary | ICD-10-CM | POA: Insufficient documentation

## 2017-06-16 DIAGNOSIS — D128 Benign neoplasm of rectum: Secondary | ICD-10-CM | POA: Insufficient documentation

## 2017-06-16 HISTORY — DX: Personal history of colonic polyps: Z86.010

## 2017-06-16 HISTORY — DX: Personal history of adenomatous and serrated colon polyps: Z86.0101

## 2017-06-16 HISTORY — DX: Rectal polyp: K62.1

## 2017-06-16 HISTORY — PX: LIPOMA EXCISION: SHX5283

## 2017-06-16 SURGERY — EXCISION LIPOMA
Anesthesia: Monitor Anesthesia Care | Site: Rectum

## 2017-06-16 MED ORDER — FENTANYL CITRATE (PF) 100 MCG/2ML IJ SOLN
INTRAMUSCULAR | Status: DC | PRN
  Administered 2017-06-16 (×2): 25 ug via INTRAVENOUS
  Administered 2017-06-16: 12.5 ug via INTRAVENOUS
  Administered 2017-06-16: 25 ug via INTRAVENOUS
  Administered 2017-06-16: 12.5 ug via INTRAVENOUS

## 2017-06-16 MED ORDER — MIDAZOLAM HCL 5 MG/5ML IJ SOLN
INTRAMUSCULAR | Status: DC | PRN
  Administered 2017-06-16: 2 mg via INTRAVENOUS

## 2017-06-16 MED ORDER — DEXAMETHASONE SODIUM PHOSPHATE 10 MG/ML IJ SOLN
INTRAMUSCULAR | Status: AC
  Filled 2017-06-16: qty 1

## 2017-06-16 MED ORDER — SODIUM CHLORIDE 0.9 % IV SOLN
250.0000 mL | INTRAVENOUS | Status: DC | PRN
  Filled 2017-06-16: qty 250

## 2017-06-16 MED ORDER — KETOROLAC TROMETHAMINE 30 MG/ML IJ SOLN
INTRAMUSCULAR | Status: DC | PRN
  Administered 2017-06-16: 30 mg via INTRAVENOUS

## 2017-06-16 MED ORDER — PROPOFOL 10 MG/ML IV BOLUS
INTRAVENOUS | Status: DC | PRN
  Administered 2017-06-16 (×3): 20 mg via INTRAVENOUS

## 2017-06-16 MED ORDER — ACETAMINOPHEN 650 MG RE SUPP
650.0000 mg | RECTAL | Status: DC | PRN
  Filled 2017-06-16: qty 1

## 2017-06-16 MED ORDER — HYDROMORPHONE HCL 1 MG/ML IJ SOLN
0.2500 mg | INTRAMUSCULAR | Status: DC | PRN
  Filled 2017-06-16: qty 0.5

## 2017-06-16 MED ORDER — HYDROCODONE-ACETAMINOPHEN 5-325 MG PO TABS: 1.0000 | ORAL_TABLET | ORAL | 0 refills | Status: DC | PRN

## 2017-06-16 MED ORDER — 0.9 % SODIUM CHLORIDE (POUR BTL) OPTIME
TOPICAL | Status: DC | PRN
  Administered 2017-06-16: 500 mL

## 2017-06-16 MED ORDER — MIDAZOLAM HCL 2 MG/2ML IJ SOLN
INTRAMUSCULAR | Status: AC
  Filled 2017-06-16: qty 2

## 2017-06-16 MED ORDER — DEXAMETHASONE SODIUM PHOSPHATE 4 MG/ML IJ SOLN
INTRAMUSCULAR | Status: DC | PRN
  Administered 2017-06-16: 10 mg via INTRAVENOUS

## 2017-06-16 MED ORDER — ACETAMINOPHEN 325 MG PO TABS
650.0000 mg | ORAL_TABLET | ORAL | Status: DC | PRN
  Filled 2017-06-16: qty 2

## 2017-06-16 MED ORDER — ONDANSETRON HCL 4 MG/2ML IJ SOLN
INTRAMUSCULAR | Status: DC | PRN
  Administered 2017-06-16: 4 mg via INTRAVENOUS

## 2017-06-16 MED ORDER — FENTANYL CITRATE (PF) 100 MCG/2ML IJ SOLN
INTRAMUSCULAR | Status: AC
  Filled 2017-06-16: qty 2

## 2017-06-16 MED ORDER — LACTATED RINGERS IV SOLN
INTRAVENOUS | Status: DC
  Administered 2017-06-16 (×2): via INTRAVENOUS
  Filled 2017-06-16: qty 1000

## 2017-06-16 MED ORDER — OXYCODONE HCL 5 MG/5ML PO SOLN
5.0000 mg | Freq: Once | ORAL | Status: DC | PRN
  Filled 2017-06-16: qty 5

## 2017-06-16 MED ORDER — OXYCODONE HCL 5 MG PO TABS
5.0000 mg | ORAL_TABLET | Freq: Once | ORAL | Status: DC | PRN
  Filled 2017-06-16: qty 1

## 2017-06-16 MED ORDER — LIDOCAINE HCL (CARDIAC) 20 MG/ML IV SOLN
INTRAVENOUS | Status: DC | PRN
  Administered 2017-06-16: 60 mg via INTRAVENOUS

## 2017-06-16 MED ORDER — BUPIVACAINE LIPOSOME 1.3 % IJ SUSP
INTRAMUSCULAR | Status: DC | PRN
  Administered 2017-06-16: 20 mL

## 2017-06-16 MED ORDER — SODIUM CHLORIDE 0.9 % IV SOLN
1.0000 g | INTRAVENOUS | Status: AC
  Administered 2017-06-16: 1 g via INTRAVENOUS
  Filled 2017-06-16 (×2): qty 1

## 2017-06-16 MED ORDER — PROPOFOL 500 MG/50ML IV EMUL
INTRAVENOUS | Status: AC
  Filled 2017-06-16: qty 50

## 2017-06-16 MED ORDER — BUPIVACAINE-EPINEPHRINE 0.5% -1:200000 IJ SOLN
INTRAMUSCULAR | Status: DC | PRN
  Administered 2017-06-16: 30 mL

## 2017-06-16 MED ORDER — OXYCODONE HCL 5 MG PO TABS
5.0000 mg | ORAL_TABLET | ORAL | Status: DC | PRN
  Filled 2017-06-16: qty 2

## 2017-06-16 MED ORDER — ONDANSETRON HCL 4 MG/2ML IJ SOLN
INTRAMUSCULAR | Status: AC
  Filled 2017-06-16: qty 2

## 2017-06-16 MED ORDER — PROPOFOL 500 MG/50ML IV EMUL
INTRAVENOUS | Status: DC | PRN
  Administered 2017-06-16: 25 ug/kg/min via INTRAVENOUS

## 2017-06-16 MED ORDER — SODIUM CHLORIDE 0.9% FLUSH
3.0000 mL | Freq: Two times a day (BID) | INTRAVENOUS | Status: DC
  Filled 2017-06-16: qty 3

## 2017-06-16 MED ORDER — SODIUM CHLORIDE 0.9% FLUSH
3.0000 mL | INTRAVENOUS | Status: DC | PRN
  Filled 2017-06-16: qty 3

## 2017-06-16 MED ORDER — LIDOCAINE 2% (20 MG/ML) 5 ML SYRINGE
INTRAMUSCULAR | Status: AC
  Filled 2017-06-16: qty 5

## 2017-06-16 SURGICAL SUPPLY — 48 items
APL SKNCLS STERI-STRIP NONHPOA (GAUZE/BANDAGES/DRESSINGS) ×1
BENZOIN TINCTURE PRP APPL 2/3 (GAUZE/BANDAGES/DRESSINGS) ×4 IMPLANT
BLADE HEX COATED 2.75 (ELECTRODE) ×3 IMPLANT
BLADE SURG 10 STRL SS (BLADE) ×2 IMPLANT
BLADE SURG 15 STRL LF DISP TIS (BLADE) IMPLANT
BLADE SURG 15 STRL SS (BLADE) ×3
BRIEF STRETCH FOR OB PAD LRG (UNDERPADS AND DIAPERS) ×6 IMPLANT
CANISTER SUCT 3000ML PPV (MISCELLANEOUS) ×3 IMPLANT
COVER BACK TABLE 60X90IN (DRAPES) ×3 IMPLANT
COVER MAYO STAND STRL (DRAPES) ×3 IMPLANT
DECANTER SPIKE VIAL GLASS SM (MISCELLANEOUS) ×1 IMPLANT
DRAPE LAPAROTOMY 100X72 PEDS (DRAPES) ×1 IMPLANT
DRAPE UTILITY XL STRL (DRAPES) ×3 IMPLANT
ELECT BLADE 6.5 .24CM SHAFT (ELECTRODE) ×2 IMPLANT
ELECT REM PT RETURN 9FT ADLT (ELECTROSURGICAL) ×3
ELECTRODE REM PT RTRN 9FT ADLT (ELECTROSURGICAL) ×1 IMPLANT
GAUZE SPONGE 4X4 12PLY STRL LF (GAUZE/BANDAGES/DRESSINGS) ×2 IMPLANT
GLOVE BIO SURGEON STRL SZ 6.5 (GLOVE) ×4 IMPLANT
GLOVE BIO SURGEONS STRL SZ 6.5 (GLOVE) ×2
GLOVE INDICATOR 7.0 STRL GRN (GLOVE) ×6 IMPLANT
GOWN STRL REUS W/ TWL LRG LVL3 (GOWN DISPOSABLE) ×1 IMPLANT
GOWN STRL REUS W/ TWL XL LVL3 (GOWN DISPOSABLE) ×2 IMPLANT
GOWN STRL REUS W/TWL LRG LVL3 (GOWN DISPOSABLE) ×3
GOWN STRL REUS W/TWL XL LVL3 (GOWN DISPOSABLE) ×3
HYDROGEN PEROXIDE 16OZ (MISCELLANEOUS) ×1 IMPLANT
KIT RM TURNOVER CYSTO AR (KITS) ×3 IMPLANT
LOOP VESSEL MAXI BLUE (MISCELLANEOUS) IMPLANT
NDL SAFETY ECLIPSE 18X1.5 (NEEDLE) IMPLANT
NEEDLE HYPO 18GX1.5 SHARP (NEEDLE)
NEEDLE HYPO 22GX1.5 SAFETY (NEEDLE) ×3 IMPLANT
NS IRRIG 500ML POUR BTL (IV SOLUTION) ×3 IMPLANT
PACK BASIN DAY SURGERY FS (CUSTOM PROCEDURE TRAY) ×3 IMPLANT
PAD ABD 8X10 STRL (GAUZE/BANDAGES/DRESSINGS) ×3 IMPLANT
PAD ARMBOARD 7.5X6 YLW CONV (MISCELLANEOUS) IMPLANT
PENCIL BUTTON HOLSTER BLD 10FT (ELECTRODE) ×3 IMPLANT
SPONGE GAUZE 4X4 12PLY STER LF (GAUZE/BANDAGES/DRESSINGS) ×3 IMPLANT
SPONGE HEMORRHOID 8X3CM (HEMOSTASIS) IMPLANT
SUT ETHIBOND 0 (SUTURE) IMPLANT
SUT VIC AB 3-0 SH 27 (SUTURE) ×9
SUT VIC AB 3-0 SH 27X BRD (SUTURE) IMPLANT
SUT VIC AB 4-0 P-3 18XBRD (SUTURE) IMPLANT
SUT VIC AB 4-0 P3 18 (SUTURE)
SYR CONTROL 10ML LL (SYRINGE) ×5 IMPLANT
TOWEL OR 17X24 6PK STRL BLUE (TOWEL DISPOSABLE) ×3 IMPLANT
TRAY DSU PREP LF (CUSTOM PROCEDURE TRAY) ×3 IMPLANT
TUBE CONNECTING 12'X1/4 (SUCTIONS) ×1
TUBE CONNECTING 12X1/4 (SUCTIONS) ×2 IMPLANT
YANKAUER SUCT BULB TIP NO VENT (SUCTIONS) ×3 IMPLANT

## 2017-06-16 NOTE — Discharge Instructions (Addendum)
Beginning the day after surgery: ? ?You may sit in a tub of warm water 2-3 times a day to relieve discomfort. ? ?Eat a regular diet high in fiber.  Avoid foods that give you constipation or diarrhea.  Avoid foods that are difficult to digest, such as seeds, nuts, corn or popcorn. ? ?Do not go any longer than 2 days without a bowel movement.  You may take a dose of Milk of Magnesia if you become constipated.   ? ?Drink 6-8 glasses of water daily. ? ?Walking is encouraged.  Avoid strenuous activity and heavy lifting for one month after surgery.   ? ?Call the office if you have any questions or concerns.  Call immediately if you develop: ? ?Excessive rectal bleeding (more than a cup or passing large clots) ?Increased discomfort ?Fever greater than 100 F ?Difficulty urinating  ?

## 2017-06-16 NOTE — Op Note (Addendum)
06/16/2017  3:53 PM  PATIENT:  Caleb Bryan  51 y.o. male  Patient Care Team: Zada Finders, MD as PCP - General (Internal Medicine)  PRE-OPERATIVE DIAGNOSIS:  distal rectal polyp  POST-OPERATIVE DIAGNOSIS:  distal rectal polyp  PROCEDURE:  TRANSANAL EXCISION OF RECTAL POLYP   Surgeon(s): Leighton Ruff, MD  ASSISTANT: none   ANESTHESIA:   local and MAC  SPECIMEN:  Source of Specimen:  distal rectal polyp  DISPOSITION OF SPECIMEN:  PATHOLOGY  COUNTS:  YES  PLAN OF CARE: Discharge to home after PACU  PATIENT DISPOSITION:  PACU - hemodynamically stable.  INDICATION: 51 y.o. M with distal rectal polyp   OR FINDINGS:  pedunculated distal rectal mass arising from posterior midline  DESCRIPTION: the patient was identified in the preoperative holding area and taken to the OR where they were laid on the operating room table.  MAC anesthesia was induced without difficulty. The patient was then positioned in lithotomy position.  The patient was then prepped and draped in usual sterile fashion.  SCDs were noted to be in place prior to the initiation of anesthesia. A surgical timeout was performed indicating the correct patient, procedure, positioning and need for preoperative antibiotics.  A rectal block was performed using Marcaine with epinephrine and Exparel.    I began with a digital rectal exam.  The mass could be palpated on a stalk arising from posterior midline approximately 2 cm from the dentate line.  I then placed a Hill-Ferguson anoscope into the anal canal and evaluated this completely.  I identified the distal border.  I then manually extracted the polyp and replace the Hill-Ferguson anoscope. I could identify the anterior border. I marked out a portion of mucosa proximal to the lesion with electrocautery. I then proceeded to dissect through this mucosal layer using cautery. After this was completed, I dissected the distal border using Metzenbaum scissors. Hemostasis was  achieved using a 3-0 Vicryl suture. The distal Starleen Blue appeared somewhat close after removing the entire specimen. I went back and took additional distal border using the Metzenbaum scissors up to the dentate line. This was all sent to pathology for further examination. The excision site was then closed using a 3-0 Vicryl suture. The patient tolerated this well. He was awakened from anesthesia and sent to the postanesthesia care unit in stable condition. All counts were correct per operating room staff.   I have reviewed the [place] and see no other narcotic prescriptions listed.

## 2017-06-16 NOTE — Transfer of Care (Signed)
Immediate Anesthesia Transfer of Care Note  Patient: Caleb Bryan  Procedure(s) Performed: Procedure(s) (LRB): TRANSANAL EXCISION OF RECTAL POLYP (N/A)  Patient Location: PACU  Anesthesia Type: General  Level of Consciousness: awake, sedated, patient cooperative and responds to stimulation  Airway & Oxygen Therapy: Patient Spontanous Breathing and Patient connected to face mask oxygen  Post-op Assessment: Report given to PACU RN, Post -op Vital signs reviewed and stable and Patient moving all extremities  Post vital signs: Reviewed and stable  Complications: No apparent anesthesia complications

## 2017-06-16 NOTE — H&P (View-Only) (Signed)
History of Present Illness Leighton Ruff MD; 4/76/5465 3:34 PM) The patient is a 51 year old male who presents with a colorectal polyp. 51 year old male who presents to the office for evaluation of a distal rectal polyp. This was seen on a colonoscopy performed by Dr. Penelope Coop. The patient has noted some rectal bleeding with wiping over the past year. He denies any pain. He denies any difficulty with bowel movements. He has never had any perianal procedures in the past.   Past Surgical History Mammie Lorenzo, LPN; 0/35/4656 8:12 PM) Colon Polyp Removal - Colonoscopy  Diagnostic Studies History Mammie Lorenzo, LPN; 7/51/7001 7:49 PM) Colonoscopy within last year  Allergies Mammie Lorenzo, LPN; 4/49/6759 1:63 PM) No Known Allergies 06/06/2017 Allergies Reconciled  Medication History Mammie Lorenzo, LPN; 8/46/6599 3:57 PM) No Current Medications Medications Reconciled  Social History Mammie Lorenzo, LPN; 0/17/7939 0:30 PM) Alcohol use Occasional alcohol use. Caffeine use Carbonated beverages, Coffee. No drug use Tobacco use Former smoker.  Family History Mammie Lorenzo, LPN; 0/92/3300 7:62 PM) Diabetes Mellitus Father.     Review of Systems Claiborne Billings Dockery LPN; 2/63/3354 5:62 PM) General Not Present- Appetite Loss, Chills, Fatigue, Fever, Night Sweats, Weight Gain and Weight Loss. Skin Not Present- Change in Wart/Mole, Dryness, Hives, Jaundice, New Lesions, Non-Healing Wounds, Rash and Ulcer. HEENT Present- Wears glasses/contact lenses. Not Present- Earache, Hearing Loss, Hoarseness, Nose Bleed, Oral Ulcers, Ringing in the Ears, Seasonal Allergies, Sinus Pain, Sore Throat, Visual Disturbances and Yellow Eyes. Respiratory Not Present- Bloody sputum, Chronic Cough, Difficulty Breathing, Snoring and Wheezing. Breast Not Present- Breast Mass, Breast Pain, Nipple Discharge and Skin Changes. Cardiovascular Not Present- Chest Pain, Difficulty Breathing Lying Down, Leg  Cramps, Palpitations, Rapid Heart Rate, Shortness of Breath and Swelling of Extremities. Gastrointestinal Not Present- Abdominal Pain, Bloating, Bloody Stool, Change in Bowel Habits, Chronic diarrhea, Constipation, Difficulty Swallowing, Excessive gas, Gets full quickly at meals, Hemorrhoids, Indigestion, Nausea, Rectal Pain and Vomiting. Male Genitourinary Not Present- Blood in Urine, Change in Urinary Stream, Frequency, Impotence, Nocturia, Painful Urination, Urgency and Urine Leakage. Musculoskeletal Not Present- Back Pain, Joint Pain, Joint Stiffness, Muscle Pain, Muscle Weakness and Swelling of Extremities. Neurological Not Present- Decreased Memory, Fainting, Headaches, Numbness, Seizures, Tingling, Tremor, Trouble walking and Weakness. Endocrine Not Present- Cold Intolerance, Excessive Hunger, Hair Changes, Heat Intolerance, Hot flashes and New Diabetes. Hematology Not Present- Blood Thinners, Easy Bruising, Excessive bleeding, Gland problems, HIV and Persistent Infections.  Vitals Claiborne Billings Dockery LPN; 5/63/8937 3:42 PM) 06/06/2017 3:29 PM Weight: 140.8 lb Height: 65in Body Surface Area: 1.7 m Body Mass Index: 23.43 kg/m  Temp.: 97.64F(Oral)  Pulse: 83 (Regular)  BP: 118/70 (Sitting, Left Arm, Standard)      Physical Exam Leighton Ruff MD; 8/76/8115 3:35 PM)  General Mental Status-Alert. General Appearance-Not in acute distress. Build & Nutrition-Well nourished. Posture-Normal posture. Gait-Normal.  Head and Neck Head-normocephalic, atraumatic with no lesions or palpable masses. Trachea-midline.  Chest and Lung Exam Chest and lung exam reveals -on auscultation, normal breath sounds, no adventitious sounds and normal vocal resonance.  Cardiovascular Cardiovascular examination reveals -normal heart sounds, regular rate and rhythm with no murmurs and no digital clubbing, cyanosis, edema, increased warmth or  tenderness.  Abdomen Inspection Inspection of the abdomen reveals - No Hernias. Palpation/Percussion Palpation and Percussion of the abdomen reveal - Soft, Non Tender, No Rigidity (guarding), No hepatosplenomegaly and No Palpable abdominal masses.  Neurologic Neurologic evaluation reveals -alert and oriented x 3 with no impairment of recent or remote memory, normal attention span and  ability to concentrate, normal sensation and normal coordination.  Musculoskeletal Normal Exam - Bilateral-Upper Extremity Strength Normal and Lower Extremity Strength Normal.  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 On exam this appears to be posterior midline. I have recommended a transanal excision of the rectal polyp. I think this can be done without much difficulty or pain to the patient. Risk of surgery include recurrence bleeding and breakdown of the surgical wound.

## 2017-06-16 NOTE — Anesthesia Preprocedure Evaluation (Addendum)
Anesthesia Evaluation  Patient identified by MRN, date of birth, ID band Patient awake    Reviewed: Allergy & Precautions, NPO status , Patient's Chart, lab work & pertinent test results  Airway Mallampati: II  TM Distance: >3 FB Neck ROM: Full    Dental no notable dental hx.    Pulmonary neg pulmonary ROS, former smoker,    breath sounds clear to auscultation       Cardiovascular negative cardio ROS   Rhythm:Regular Rate:Normal     Neuro/Psych negative neurological ROS  negative psych ROS   GI/Hepatic negative GI ROS, Neg liver ROS,   Endo/Other  negative endocrine ROS  Renal/GU negative Renal ROS  negative genitourinary   Musculoskeletal negative musculoskeletal ROS (+)   Abdominal   Peds negative pediatric ROS (+)  Hematology negative hematology ROS (+)   Anesthesia Other Findings   Reproductive/Obstetrics negative OB ROS                            Anesthesia Physical Anesthesia Plan  ASA: I  Anesthesia Plan: MAC   Post-op Pain Management:    Induction: Intravenous  PONV Risk Score and Plan: 2 and Ondansetron and Dexamethasone  Airway Management Planned: Fiberoptic Intubation Planned and Simple Face Mask  Additional Equipment:   Intra-op Plan:   Post-operative Plan:   Informed Consent: I have reviewed the patients History and Physical, chart, labs and discussed the procedure including the risks, benefits and alternatives for the proposed anesthesia with the patient or authorized representative who has indicated his/her understanding and acceptance.     Plan Discussed with: CRNA  Anesthesia Plan Comments:         Anesthesia Quick Evaluation

## 2017-06-16 NOTE — Anesthesia Procedure Notes (Addendum)
Procedure Name: MAC Date/Time: 06/16/2017 3:16 PM Performed by: Justice Rocher Pre-anesthesia Checklist: Patient identified, Emergency Drugs available, Suction available, Patient being monitored and Timeout performed Patient Re-evaluated:Patient Re-evaluated prior to induction Oxygen Delivery Method: Simple face mask Preoxygenation: Pre-oxygenation with 100% oxygen Induction Type: IV induction Placement Confirmation: positive ETCO2 and breath sounds checked- equal and bilateral

## 2017-06-16 NOTE — Interval H&P Note (Signed)
History and Physical Interval Note:  06/16/2017 1:20 PM  [place]  has presented today for surgery, with the diagnosis of distal rectal polyp  The various methods of treatment have been discussed with the patient and family. After consideration of risks, benefits and other options for treatment, the patient has consented to  Procedure(s): TRANSANAL EXCISION OF RECTAL POLYP (N/A) as a surgical intervention .  The patient's history has been reviewed, patient examined, no change in status, stable for surgery.  I have reviewed the patient's chart and labs.  Questions were answered to the patient's satisfaction.     Rosario Adie, MD  Colorectal and [facility]

## 2017-06-17 ENCOUNTER — Encounter (HOSPITAL_BASED_OUTPATIENT_CLINIC_OR_DEPARTMENT_OTHER): Payer: Self-pay | Admitting: General Surgery

## 2017-06-21 NOTE — Anesthesia Postprocedure Evaluation (Signed)
Anesthesia Post Note  Patient: Krystal Delduca  Procedure(s) Performed: Procedure(s) (LRB): TRANSANAL EXCISION OF RECTAL POLYP (N/A)     Patient location during evaluation: PACU Anesthesia Type: MAC Level of consciousness: awake and alert Pain management: pain level controlled Vital Signs Assessment: post-procedure vital signs reviewed and stable Respiratory status: spontaneous breathing, nonlabored ventilation, respiratory function stable and patient connected to nasal cannula oxygen Cardiovascular status: stable and blood pressure returned to baseline Anesthetic complications: no    Last Vitals:  Vitals:   06/16/17 1630 06/16/17 1701  BP: (!) 129/95 124/80  Pulse: 73 69  Resp: 16 16  Temp:  36.8 C  SpO2: 100% 100%    Last Pain:  Vitals:   06/17/17 1319  TempSrc:   PainSc: 0-No pain                 Zarrah Loveland,JAMES TERRILL

## 2017-07-25 ENCOUNTER — Ambulatory Visit: Payer: Self-pay

## 2017-07-27 ENCOUNTER — Encounter: Payer: Self-pay | Admitting: Internal Medicine

## 2017-08-01 ENCOUNTER — Ambulatory Visit: Payer: Self-pay

## 2017-08-17 ENCOUNTER — Ambulatory Visit (INDEPENDENT_AMBULATORY_CARE_PROVIDER_SITE_OTHER): Payer: Self-pay | Admitting: Internal Medicine

## 2017-08-17 ENCOUNTER — Encounter: Payer: Self-pay | Admitting: Internal Medicine

## 2017-08-17 VITALS — BP 123/76 | HR 80 | Temp 98.0°F | Ht 65.0 in | Wt 140.4 lb

## 2017-08-17 DIAGNOSIS — Z1159 Encounter for screening for other viral diseases: Secondary | ICD-10-CM

## 2017-08-17 DIAGNOSIS — Z114 Encounter for screening for human immunodeficiency virus [HIV]: Secondary | ICD-10-CM

## 2017-08-17 DIAGNOSIS — Z0000 Encounter for general adult medical examination without abnormal findings: Secondary | ICD-10-CM

## 2017-08-17 DIAGNOSIS — R4589 Other symptoms and signs involving emotional state: Secondary | ICD-10-CM

## 2017-08-17 DIAGNOSIS — R351 Nocturia: Secondary | ICD-10-CM

## 2017-08-17 DIAGNOSIS — Z23 Encounter for immunization: Secondary | ICD-10-CM | POA: Insufficient documentation

## 2017-08-17 DIAGNOSIS — Z8639 Personal history of other endocrine, nutritional and metabolic disease: Secondary | ICD-10-CM

## 2017-08-17 NOTE — Assessment & Plan Note (Signed)
Will check Vitamin D today.

## 2017-08-17 NOTE — Assessment & Plan Note (Signed)
Patient with several months of nocturia. He is not diabetic and workup for his nocturia in the past did not reveal a UTI. He is not complaining of dysuria or hematuria. This may be secondary to increased fluid intake prior to bedtime or from BPH with incomplete voiding during the daytime. - Advised to limit fluids for at least 2 hours prior to bedtime - Consider Luts Questionnaire on follow up to assess for BPH; can consider Tamsulosin during daytime if indicated

## 2017-08-17 NOTE — Patient Instructions (Signed)
It was a pleasure to see you again Caleb Bryan.  We are checking your Vitamin D levels today.  I am not sure why you are peeing more frequently at night. There does not seem to be any sign of infection or diabetes.  Please monitor how much fluids you are drinking before bedtime and try to limit any fluids for at least 2 hours before bedtime.  Please follow up with me in 1 year or sooner if needed.

## 2017-08-17 NOTE — Assessment & Plan Note (Signed)
Patient with maybe 2 episodes of anxious feelings in the last 2 years which he correlates temporally after having to wear a cast on his right foot. He cannot think of anything in particular trigger for his anxiety. He thinks he might be feeling restless from not exercising as much as he used to after his foot fracture and rectal polyp excision.  - Advised to monitor symptoms and increase exercise as tolerated - Consider GAD screening questionnaire on follow up

## 2017-08-17 NOTE — Progress Notes (Signed)
CC: Nocturia  HPI:  [name] is a 51 y.o. Spanish speaking male with PMH as listed below who presents for routine follow up and complaint of nocturia, occasional anxiousness, and reported history of vitamin D deficiency. Communication is via tele-interpretor.   Nocturia: Patient has several months history of waking at night to urinate. Usually occurs 2-3 times per night. He does not feel he urinates frequently during the day. He denies any weak stream or dribbling sensation. He denies any dysuria, hematuria, or polydipsia. He does drink some fluid in the evening before bedtime. He had similar complaints in May and UA at the time was without evidence of UTI. Hgb A1c on 03/15/17 was 5.6. He is not taking any medications.  H/o Vitamin D Deficiency: He reports a history of vitamin d deficiency and requesting a recheck today. There are no vitamin d levels in our system.  Anxiousness: Patient broke his foot 2 years ago playing soccer (right calcaneal fracture) and was placed in a cast. He says since then he has had about 2 episodes of feeling anxious. He cannot think of anything that causes him to feel anxious. He is not fearful of further fractures. He says he wants to improve how much he is exercising which has been less frequent since his foot fracture and surgery for rectal polyp removal.  Healthcare Maintenance: Patient requesting flu shot and agreeable for HIV and HCV screening.  Past Medical History:  Diagnosis Date  . 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 History of kidney stones    2016  . Rectal polyp    Review of Systems:   Review of Systems  Constitutional: Negative for chills, fever and weight loss.  Respiratory: Negative for cough and shortness of breath.   Cardiovascular: Negative for chest pain and leg swelling.  Genitourinary: Negative for dysuria, flank pain, hematuria and urgency.       Nocturia  Musculoskeletal: Negative  for falls and joint pain.  Endo/Heme/Allergies: Negative for polydipsia.  Psychiatric/Behavioral:       Two episodes of anxiousness     Physical Exam:  Vitals:   08/17/17 1541  BP: 123/76  Pulse: 80  Temp: 98 F (36.7 C)  TempSrc: Oral  SpO2: 100%  Weight: 140 lb 6.4 oz (63.7 kg)  Height: 5\' 5"  (1.651 m)   Physical Exam  Constitutional: He is oriented to person, place, and time. He appears well-developed and well-nourished. No distress.  HENT:  Head: Normocephalic and atraumatic.  Mouth/Throat: Oropharynx is clear and moist.  Cardiovascular: Normal rate and regular rhythm.   No murmur heard. Pulmonary/Chest: Effort normal. No respiratory distress. He has no wheezes.  Abdominal: Soft. He exhibits no distension.  Musculoskeletal: Normal range of motion. He exhibits no edema or tenderness.  Neurological: He is alert and oriented to person, place, and time.  Skin: Skin is warm. Capillary refill takes less than 2 seconds. He is not diaphoretic.  Psychiatric: He has a normal mood and affect.    Assessment & Plan:   See Encounters Tab for problem based charting.  Patient discussed with Dr. Evette Doffing  Nocturia Patient with several months of nocturia. He is not diabetic and workup for his nocturia in the past did not reveal a UTI. He is not complaining of dysuria or hematuria. This may be secondary to increased fluid intake prior to bedtime or from BPH with incomplete voiding during the daytime. - Advised to limit fluids for at  least 2 hours prior to bedtime - Consider Luts Questionnaire on follow up to assess for BPH; can consider Tamsulosin during daytime if indicated  H/O vitamin D deficiency Will check Vitamin D today.  Feeling anxious Patient with maybe 2 episodes of anxious feelings in the last 2 years which he correlates temporally after having to wear a cast on his right foot. He cannot think of anything in particular trigger for his anxiety. He thinks he might be  feeling restless from not exercising as much as he used to after his foot fracture and rectal polyp excision.  - Advised to monitor symptoms and increase exercise as tolerated - Consider GAD screening questionnaire on follow up  Healthcare maintenance Flu shot given today. Checking HIV and HCV antibody today.  Need for immunization against influenza Flu shot given today.

## 2017-08-17 NOTE — Assessment & Plan Note (Signed)
Flu shot given today

## 2017-08-17 NOTE — Assessment & Plan Note (Signed)
Flu shot given today. Checking HIV and HCV antibody today.

## 2017-08-18 LAB — VITAMIN D 25 HYDROXY (VIT D DEFICIENCY, FRACTURES): VIT D 25 HYDROXY: 22.6 ng/mL — AB (ref 30.0–100.0)

## 2017-08-18 LAB — HEPATITIS C ANTIBODY: Hep C Virus Ab: <0.1 s/co ratio (ref 0.0–0.9)

## 2017-08-18 LAB — HIV ANTIBODY (ROUTINE TESTING W REFLEX): HIV Screen 4th Generation wRfx: NONREACTIVE

## 2017-08-18 NOTE — Progress Notes (Signed)
Internal Medicine Clinic Attending  Case discussed with Dr. Patel at the time of the visit.  We reviewed the resident's history and exam and pertinent patient test results.  I agree with the assessment, diagnosis, and plan of care documented in the resident's note.  

## 2017-09-23 ENCOUNTER — Ambulatory Visit: Payer: Self-pay

## 2017-11-16 ENCOUNTER — Ambulatory Visit: Payer: Self-pay

## 2018-03-06 ENCOUNTER — Ambulatory Visit (INDEPENDENT_AMBULATORY_CARE_PROVIDER_SITE_OTHER): Payer: Self-pay | Admitting: Internal Medicine

## 2018-03-06 ENCOUNTER — Other Ambulatory Visit: Payer: Self-pay

## 2018-03-06 ENCOUNTER — Encounter: Payer: Self-pay | Admitting: Internal Medicine

## 2018-03-06 VITALS — BP 125/80 | HR 83 | Temp 98.0°F | Ht 65.0 in | Wt 143.0 lb

## 2018-03-06 DIAGNOSIS — G8929 Other chronic pain: Secondary | ICD-10-CM

## 2018-03-06 DIAGNOSIS — M25461 Effusion, right knee: Secondary | ICD-10-CM | POA: Insufficient documentation

## 2018-03-06 NOTE — Patient Instructions (Addendum)
It was great meeting you today! I'm sorry your knee has been bothering you a little over the past week.   I believe you have an "overuse" injury of your knee. This should get better with time with rest, ice, elevation and anti-inflammatory medications.   If your symptoms worsen, or do not improve within the next few weeks, please let us know.   Otherwise, please make an appointment to be seen by your primary care physician in 13-months for routine follow-up.

## 2018-03-06 NOTE — Progress Notes (Signed)
   CC: Evaluation of right knee pain, swelling  HPI:  [name] is a 52 y.o. M without significant medical history who presents today for evaluation of a 1-week history of right knee pain and swelling.   For details regarding today's visit and the status of their chronic medical issues, please refer to the assessment and plan.  Past Medical History:  Diagnosis Date  . 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 History of kidney stones    2016  . Rectal polyp    Review of Systems:   General: Denies fevers, chills, fatigue HEENT: Denies acute changes in vision, headache, dizziness Cardiac: Denies CP, SOB, palpitations Abd: Denies abdominal pain, dysuria, changes in bowels Extremities: Admits to pain and swelling. Denies weakness or numbness  Physical Exam: General: Alert, in no acute distress. Pleasant and conversant HEENT: No icterus, injection or ptosis. No hoarseness or dysarthria  Cardiac: RRR, no MGR appreciated Pulmonary: CTA BL with normal WOB on RA. Able to speak in complete sentences Extremities: Warm, perfused. No pedal edema. Left knee grossly normal. Inspection of Right knee with mild-mod effusion under patella without erythema or discoloration. Palpation with freely flowing effusion and minimal TTP and crepitus of medial joint line. No fullness or tenderness in popliteal fossa. AROM intact and has normal gait. Special testing with negative A/P Drawers, negative varus/valgus testing, though maybe a mildly positive McMurray test.   Vitals:   03/06/18 1420  BP: 125/80  Pulse: 83  Temp: 98 F (36.7 C)  TempSrc: Oral  SpO2: 96%  Weight: 143 lb (64.9 kg)  Height: 5\' 5"  (1.651 m)   Body mass index is 23.8 kg/m.  Assessment & Plan:   See Encounters Tab for problem based charting.  Patient discussed with Dr. Angelia Mould

## 2018-03-06 NOTE — Assessment & Plan Note (Signed)
Assessment: 52 y/o M presents today for evaluation of 10-day history of right knee pain and swelling. 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 He then noted gradual development of right knee swelling and pain. Denies any injury, trauma and specifically denies "plant & twist" injury or blows to the knee. In addition, he denies any popping, clicking, inability to bear weight or instability. No fevers, chills, erythema, significant tenderness or prior history of similar episodes.   Exam with knee effusion deep to the patella without tenderness or erythema. Intact ROM and special knee testing grossly negative, but had mild tenderness with McMurray test. Some crepitus at medial joint line, but exam otherwise normal.   Plan: Suspect overuse injury related to possible underlying OA vs minor tear of MM. Overall he has minimal pain (rated 2/10), swelling and does not have functional limitation. He also has no joint instability to suggest major underlying ligamentous pathology.  -Conservative management with ice, NSAIDs and avoiding overuse -RTC should symptoms worsen, develop erythema or fevers

## 2018-03-07 NOTE — Progress Notes (Deleted)
Thank you! I appreciate the feedback -- billing is something I've been really trying to improve lately.

## 2018-03-07 NOTE — Progress Notes (Signed)
Internal Medicine Clinic Attending  Case discussed with Dr. Molt at the time of the visit.  We reviewed the resident's history and exam and pertinent patient test results.  I agree with the assessment, diagnosis, and plan of care documented in the resident's note. 

## 2018-05-09 ENCOUNTER — Ambulatory Visit: Payer: Self-pay

## 2018-05-30 ENCOUNTER — Encounter: Payer: Self-pay | Admitting: *Deleted

## 2018-11-29 ENCOUNTER — Ambulatory Visit: Payer: Self-pay

## 2019-04-29 ENCOUNTER — Encounter: Payer: Self-pay | Admitting: *Deleted

## 2019-06-18 ENCOUNTER — Ambulatory Visit: Payer: Self-pay

## 2019-07-11 ENCOUNTER — Other Ambulatory Visit: Payer: Self-pay

## 2019-07-11 ENCOUNTER — Ambulatory Visit (INDEPENDENT_AMBULATORY_CARE_PROVIDER_SITE_OTHER): Payer: Self-pay | Admitting: *Deleted

## 2019-07-11 ENCOUNTER — Ambulatory Visit: Payer: Self-pay

## 2019-07-11 DIAGNOSIS — Z23 Encounter for immunization: Secondary | ICD-10-CM

## 2019-08-01 ENCOUNTER — Other Ambulatory Visit: Payer: Self-pay

## 2019-08-01 ENCOUNTER — Ambulatory Visit: Payer: Self-pay | Admitting: Internal Medicine

## 2019-08-01 VITALS — BP 127/93 | HR 84 | Temp 98.2°F | Ht 64.0 in | Wt 143.2 lb

## 2019-08-01 DIAGNOSIS — Z0000 Encounter for general adult medical examination without abnormal findings: Secondary | ICD-10-CM

## 2019-08-01 DIAGNOSIS — M25461 Effusion, right knee: Secondary | ICD-10-CM

## 2019-08-01 DIAGNOSIS — D128 Benign neoplasm of rectum: Secondary | ICD-10-CM

## 2019-08-01 DIAGNOSIS — Z79899 Other long term (current) drug therapy: Secondary | ICD-10-CM

## 2019-08-01 DIAGNOSIS — R5383 Other fatigue: Secondary | ICD-10-CM

## 2019-08-01 DIAGNOSIS — K0889 Other specified disorders of teeth and supporting structures: Secondary | ICD-10-CM

## 2019-08-01 DIAGNOSIS — R1031 Right lower quadrant pain: Secondary | ICD-10-CM

## 2019-08-01 HISTORY — DX: Other fatigue: R53.83

## 2019-08-01 NOTE — Assessment & Plan Note (Signed)
Patient states that he occasionally notes a needlestick type sensation in his right groin area that occurs when he is driving. 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   Patient without any protrusion to suggest hernia.  Assessment and plan  It is possible that the patient has a femoral head osteoarthritis causing discomfort. Asked patient to monitor the symptoms and let us know if they are occurring frequently.

## 2019-08-01 NOTE — Patient Instructions (Signed)
Fue un placer verlo hoy Sr. Alver Fisher. Realice los siguientes cambios:  Anson Crofts me hice un anlisis de sangre y te llamar si hay alguna anomala.  Si tiene alguna pregunta o inquietud, llame a nuestra clnica al [phone] [employer] 9:00 a. M. A las 5:00 p. M. Y despus del horario de atencin llame al [phone] y pregunte por el residente de medicina interna de [place]. Si cree que tiene Engineering geologist, llame al 911.  Gracias, esperamos poder ayudarlo a mantenerse saludable.  Lars Mage, MD [facility] PGY3   It was a pleasure to see you today Mr. Kil. Please make the following changes:  I took some blood work today and will call you if there are any abnormalities.   If you have any questions or concerns, please call our clinic at [phone] between 9am-5pm and after hours call [phone] and ask for the internal medicine resident on call. If you feel you are having a medical emergency please call 911.   Thank you, we look forward to help you remain healthy!  Lars Mage, MD Internal Medicine PGY3

## 2019-08-01 NOTE — Assessment & Plan Note (Signed)
The patient stated that he has been noticing over the past few months that he has been feeling tired at the end of the day. Patient vitamin d level was low at 22.6 in 2018. He mentions that he has been taking 1 tablet of vitamin d (thinks it might be 1000 iu) most days.   Assessment and plan  Will check electrolyte levels and tsh. Patient does not have insurance, so will consider doing vitamin b12 at next visit.

## 2019-08-01 NOTE — Progress Notes (Signed)
   CC: right groin discomfort   HPI:  [name] is a 53 y.o. with history of right knee effusion, chronic dental pain, tubulovillous adenoma of rectum who presents for follow up for right groin discomfort. Please see problem based charting for evaluation, assessment, and plan.  Past Medical History:  Diagnosis Date  . 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 History of kidney stones    2016  . Rectal polyp    Review of Systems:    Review of Systems  Constitutional: Negative for chills and fever.  Respiratory: Negative for cough and shortness of breath.   Cardiovascular: Negative for chest pain.  Neurological: Negative for dizziness and headaches.   Physical Exam:  Vitals:   08/01/19 1525  BP: (!) 127/93  Pulse: 84  Temp: 98.2 F (36.8 C)  TempSrc: Oral  SpO2: 98%  Weight: 143 lb 3.2 oz (65 kg)  Height: 5\' 4"  (1.626 m)   Physical Exam  Constitutional: Appears well-developed and well-nourished. No distress.  HENT:  Head: Normocephalic and atraumatic.  Eyes: Conjunctivae are normal.  Cardiovascular: Normal rate, regular rhythm and normal heart sounds.  Respiratory: Effort normal and breath sounds normal. No respiratory distress. No wheezes.  GI: Soft. Bowel sounds are normal. No distension. There is no tenderness.  Musculoskeletal: No edema.  Neurological: Is alert.  Skin: Not diaphoretic. No erythema.  Psychiatric: Normal mood and affect. Behavior is normal. Judgment and thought content normal.   Assessment & Plan:   See Encounters Tab for problem based charting.  Patient discussed with Dr. Evette Doffing

## 2019-08-01 NOTE — Assessment & Plan Note (Signed)
Will check bmp 

## 2019-08-02 LAB — BMP8+ANION GAP
Anion Gap: 15 mmol/L (ref 10.0–18.0)
BUN/Creatinine Ratio: 15 (ref 9–20)
BUN: 13 mg/dL (ref 6–24)
CO2: 23 mmol/L (ref 20–29)
Calcium: 9.2 mg/dL (ref 8.7–10.2)
Chloride: 100 mmol/L (ref 96–106)
Creatinine, Ser: 0.88 mg/dL (ref 0.76–1.27)
GFR calc Af Amer: 112 mL/min/{1.73_m2} (ref >59)
GFR calc non Af Amer: 97 mL/min/{1.73_m2} (ref >59)
Glucose: 89 mg/dL (ref 65–99)
Potassium: 4.6 mmol/L (ref 3.5–5.2)
Sodium: 138 mmol/L (ref 134–144)

## 2019-08-02 LAB — TSH: TSH: 0.792 u[IU]/mL (ref 0.450–4.500)

## 2019-08-02 NOTE — Progress Notes (Signed)
Internal Medicine Clinic Attending  Case discussed with Dr. Chundi at the time of the visit.  We reviewed the resident's history and exam and pertinent patient test results.  I agree with the assessment, diagnosis, and plan of care documented in the resident's note. 

## 2019-09-07 ENCOUNTER — Ambulatory Visit (INDEPENDENT_AMBULATORY_CARE_PROVIDER_SITE_OTHER): Payer: Self-pay

## 2019-09-07 ENCOUNTER — Other Ambulatory Visit: Payer: Self-pay

## 2019-09-07 ENCOUNTER — Encounter (HOSPITAL_COMMUNITY): Payer: Self-pay

## 2019-09-07 ENCOUNTER — Ambulatory Visit (HOSPITAL_COMMUNITY)
Admission: EM | Admit: 2019-09-07 | Discharge: 2019-09-07 | Disposition: A | Discharge status: Home / Self Care | Payer: Self-pay | Attending: Family Medicine | Admitting: Family Medicine

## 2019-09-07 DIAGNOSIS — S20221A Contusion of right back wall of thorax, initial encounter: Secondary | ICD-10-CM

## 2019-09-07 LAB — POCT URINALYSIS DIP (DEVICE)
Glucose, UA: NEGATIVE mg/dL
Hgb urine dipstick: NEGATIVE
Leukocytes,Ua: NEGATIVE
Nitrite: NEGATIVE
Protein, ur: 30 mg/dL — AB
Specific Gravity, Urine: 1.02 (ref 1.005–1.030)
Urobilinogen, UA: 1 mg/dL (ref 0.0–1.0)
pH: 7 (ref 5.0–8.0)

## 2019-09-07 MED ORDER — DICLOFENAC SODIUM 75 MG PO TBEC: 75.0000 mg | DELAYED_RELEASE_TABLET | Freq: Two times a day (BID) | ORAL | 0 refills | Status: DC

## 2019-09-07 NOTE — ED Triage Notes (Signed)
Pt states last nigh he slipped and fell down on the stairs. Pt states is difficult and painful to sit down and stand up. Pt is taking ibuprofen.

## 2019-09-07 NOTE — ED Provider Notes (Signed)
Caleb Bryan    CSN: [id number] Arrival date & time: 09/07/19  1037      History   Chief Complaint Chief Complaint  Patient presents with  . Back Pain    HPI Caleb Bryan is a 53 y.o. male.   "Initial" [facility] patient visit for this 53 yo man  Pt states last night he slipped and fell down on the stairs. Pt states is difficult and painful to sit down and stand up. Pt is taking ibuprofen.   He is hurting right lower posterior chest.  No pain with deep breath.  No other injury.      Past Medical History:  Diagnosis Date  . 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 History of kidney stones    2016  . Rectal polyp     Patient Active Problem List   Diagnosis Date Noted  . Fatigue 08/01/2019  . Discomfort of right groin 08/01/2019  . Knee effusion, right 03/06/2018  . H/O vitamin D deficiency 08/17/2017  . Feeling anxious 08/17/2017  . Tubulovillous adenoma of rectum 05/17/2017  . Healthcare maintenance 03/15/2017  . Chronic dental pain 03/15/2017  . Nocturia 03/15/2017    Past Surgical History:  Procedure Laterality Date  . COLONOSCOPY WITH PROPOFOL N/A 04/21/2017   Procedure: COLONOSCOPY WITH PROPOFOL;  Surgeon: Wonda Horner, MD;  Location: [facility] ENDOSCOPY;  Service: Endoscopy;  Laterality: N/A;  . LIPOMA EXCISION N/A 06/16/2017   Procedure: TRANSANAL EXCISION OF RECTAL POLYP;  Surgeon: Leighton Ruff, MD;  Location: [facility];  Service: General;  Laterality: N/A;       Home Medications    Prior to Admission medications   Medication Sig Start Date End Date Taking? Authorizing Provider  ibuprofen (ADVIL) 200 MG tablet Take 200 mg by mouth every 6 (six) hours as needed.   Yes [provider]  diclofenac (VOLTAREN) 75 MG EC tablet Take 1 tablet (75 mg total) by mouth 2 (two) times daily. 09/07/19   Robyn Haber, MD    Family History Family History  Problem Relation Age of Onset  .  Diabetes Father     Social History Social History   Tobacco Use  . Smoking status: Current Some Day Smoker    Types: Cigarettes    Last attempt to quit: 06/08/1997    Years since quitting: 22.2  . Smokeless tobacco: Never Used  . Tobacco comment: 0nce a week sometimes  Substance Use Topics  . Alcohol use: Yes    Comment: OCCASIONAL  . Drug use: No     Allergies   Patient has no known allergies.   Review of Systems Review of Systems  Cardiovascular: Positive for chest pain.  All other systems reviewed and are negative.    Physical Exam Triage Vital Signs ED Triage Vitals [09/07/19 1050]  Enc Vitals Group     BP      Pulse      Resp      Temp      Temp src      SpO2      Weight      Height      Head Circumference      Peak Flow      Pain Score 10     Pain Loc      Pain Edu?      Excl. in [place]?    No data found.  Updated Vital Signs BP 128/88 (BP Location:  Right Arm)   Pulse 75   Temp 97.6 F (36.4 C) (Temporal)   Resp 16   SpO2 95%    Physical Exam Vitals signs and nursing note reviewed.  Constitutional:      General: He is not in acute distress.    Appearance: Normal appearance. He is normal weight. He is not ill-appearing or toxic-appearing.  Eyes:     Conjunctiva/sclera: Conjunctivae normal.  Neck:     Musculoskeletal: Normal range of motion and neck supple.  Cardiovascular:     Rate and Rhythm: Normal rate.  Pulmonary:     Effort: Pulmonary effort is normal.     Breath sounds: Normal breath sounds.  Musculoskeletal: Normal range of motion.       Arms:  Skin:    General: Skin is warm and dry.     Findings: No bruising.  Neurological:     General: No focal deficit present.     Mental Status: He is alert and oriented to person, place, and time.  Psychiatric:        Mood and Affect: Mood normal.        Behavior: Behavior normal.      UC Treatments / Results  Labs (all labs ordered are listed, but only abnormal results are  displayed) Labs Reviewed  POCT URINALYSIS DIP (DEVICE) - Abnormal; Notable for the following components:      Result Value   Bilirubin Urine SMALL (*)    Ketones, ur TRACE (*)    Protein, ur 30 (*)    All other components within normal limits    EKG   Radiology No results found.  Procedures Procedures (including critical care time)  Medications Ordered in UC Medications - No data to display  Initial Impression / Assessment and Plan / UC Course  I have reviewed the triage vital signs and the nursing notes.  Pertinent labs & imaging results that were available during my care of the patient were reviewed by me and considered in my medical decision making (see chart for details).    Final Clinical Impressions(s) / UC Diagnoses   Final diagnoses:  Contusion, back, right, initial encounter   Discharge Instructions   None    ED Prescriptions    Medication Sig Dispense Auth. Provider   diclofenac (VOLTAREN) 75 MG EC tablet Take 1 tablet (75 mg total) by mouth 2 (two) times daily. 14 tablet Robyn Haber, MD     I have reviewed the PDMP during this encounter.   Robyn Haber, MD 09/07/19 1135

## 2019-11-20 ENCOUNTER — Other Ambulatory Visit: Payer: Self-pay

## 2019-11-20 ENCOUNTER — Ambulatory Visit: Payer: Self-pay | Admitting: Internal Medicine

## 2019-11-20 ENCOUNTER — Encounter: Payer: Self-pay | Admitting: Internal Medicine

## 2019-11-20 DIAGNOSIS — R030 Elevated blood-pressure reading, without diagnosis of hypertension: Secondary | ICD-10-CM

## 2019-11-20 DIAGNOSIS — R5383 Other fatigue: Secondary | ICD-10-CM

## 2019-11-20 DIAGNOSIS — M25562 Pain in left knee: Secondary | ICD-10-CM

## 2019-11-20 NOTE — Patient Instructions (Signed)
It was a pleasure seeing you today, Caleb Bryan. For your knee, you should try some ice and Advil (ibuprofen). If it worsens or does not improve over the next few weeks, please reach out to me. I would also ask that you write down your blood pressures over the next 2 weeks and send them to me. We can better evaluate your need for antihypertensives after that. Please feel free to reach out me sooner if anything comes up in the meantime. You can contact me via MyChart or calling the clinic.  Take care!

## 2019-11-21 DIAGNOSIS — M25562 Pain in left knee: Secondary | ICD-10-CM | POA: Insufficient documentation

## 2019-11-21 DIAGNOSIS — R030 Elevated blood-pressure reading, without diagnosis of hypertension: Secondary | ICD-10-CM | POA: Insufficient documentation

## 2019-11-21 NOTE — Assessment & Plan Note (Addendum)
Patient presenting today for follow up on fatigue. He notes that symptoms have improved.  Sleeps 6-7h/night. Does snore but no paroxysmal dyspnea or apneic episodes noted by wife.  Denies depressive symptoms. Denies nocturia. Labs from prior visit reviewed and unrevealing. Discussed sleep hygiene and encouraged to try to get 7-8h/night. Plan: given symptom improvement, no further management at this time.

## 2019-11-21 NOTE — Assessment & Plan Note (Signed)
No prior diagnosis of hypertension. Blood pressure reading in the clinic today is mildly elevated. Patient reports that he had a stressful day at work.  Reviewed prior blood pressure readings which have been acceptable.  Plan: Discussed with him to monitor his blood pressure at home over the next 1-2w and to send me the results at which time, further evaluation can occur.

## 2019-11-21 NOTE — Progress Notes (Signed)
   CC: knee pain  HPI:  [name] is a 54 y.o. male who presents for follow up on fatigue and evaluation of left knee pain. Interpreter offered as Spanish is his primary language. Patient declined. Please see problem based assessment and plan for additional details.     Past Medical History:  Diagnosis Date  . 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 History of kidney stones    2016  . Rectal polyp     Review of Systems:  Review of Systems - General ROS: negative for - chills, fatigue or fever Cardiovascular ROS: negative for - chest pain Musculoskeletal ROS: positive for - joint pain negative for - muscular weakness   Physical Exam:  Vitals:   11/20/19 1544 11/20/19 1554  BP: (!) 134/103 (!) 136/97  Pulse: 86 85  Temp: 98.2 F (36.8 C)   TempSrc: Oral   SpO2: 98%   Weight: 142 lb 4.8 oz (64.5 kg)   Height: 5\' 4"  (1.626 m)     GENERAL: well appearing, in no apparent distress CARDIAC: heart regular rate and rhythm, no peripheral edema appreciated PULMONARY: lung sounds clear to auscultation MSK: mild effusion of the left knee. No erythema. No pain on palpation. ROM intact. No crepitus. Snapping sensation appreciated over the medial knee. Remainder of exam unremarkable.   Assessment & Plan:   See Encounters Tab for problem based charting.  Pertinent labs & imaging results that were available during my care of the patient were reviewed by me and considered in my medical decision making  Patient is in agreement with the plan and endorses no further questions at this time.  Patient seen with Dr. Marylyn Ishihara, MD Internal Medicine Resident-PGY1 11/21/19

## 2019-11-21 NOTE — Assessment & Plan Note (Signed)
Patient notes injury to his left knee several weeks ago. Since that time, the pain has resolved however he does have a snapping feeling that occurs over his medial knee. Denies knee instability or catching/locking sensation. Has not been using any medication to assist with this. PE significant for small effusion and snapping feeling with knee extension. No pain on palpation. ROM intact.   Assessment: MCL tendonitis with small effusion. Given lack of knee instability or catching sensation, unlikely to be meniscus or complete tendon tear.   Plan: since symptoms are improving, I encouraged him to try ibuprofen and RICE over the next 1-2w. If symptoms are not improving or worsen, can consider further imaging at that time.

## 2019-11-21 NOTE — Progress Notes (Signed)
Internal Medicine Clinic Attending  Case discussed with Dr. Christian at the time of the visit.  We reviewed the resident's history and exam and pertinent patient test results.  I agree with the assessment, diagnosis, and plan of care documented in the resident's note.    

## 2020-03-26 ENCOUNTER — Ambulatory Visit: Payer: Self-pay

## 2020-09-20 IMAGING — DX DG RIBS W/ CHEST 3+V*R*
3 series · 3 of 3 positions shown · non-contrast
Comparison: None.

CLINICAL DATA: Right posterior rib pain following a fall down
stairs last night.

EXAM:
RIGHT RIBS AND CHEST - 3+ VIEW

[chest pa]
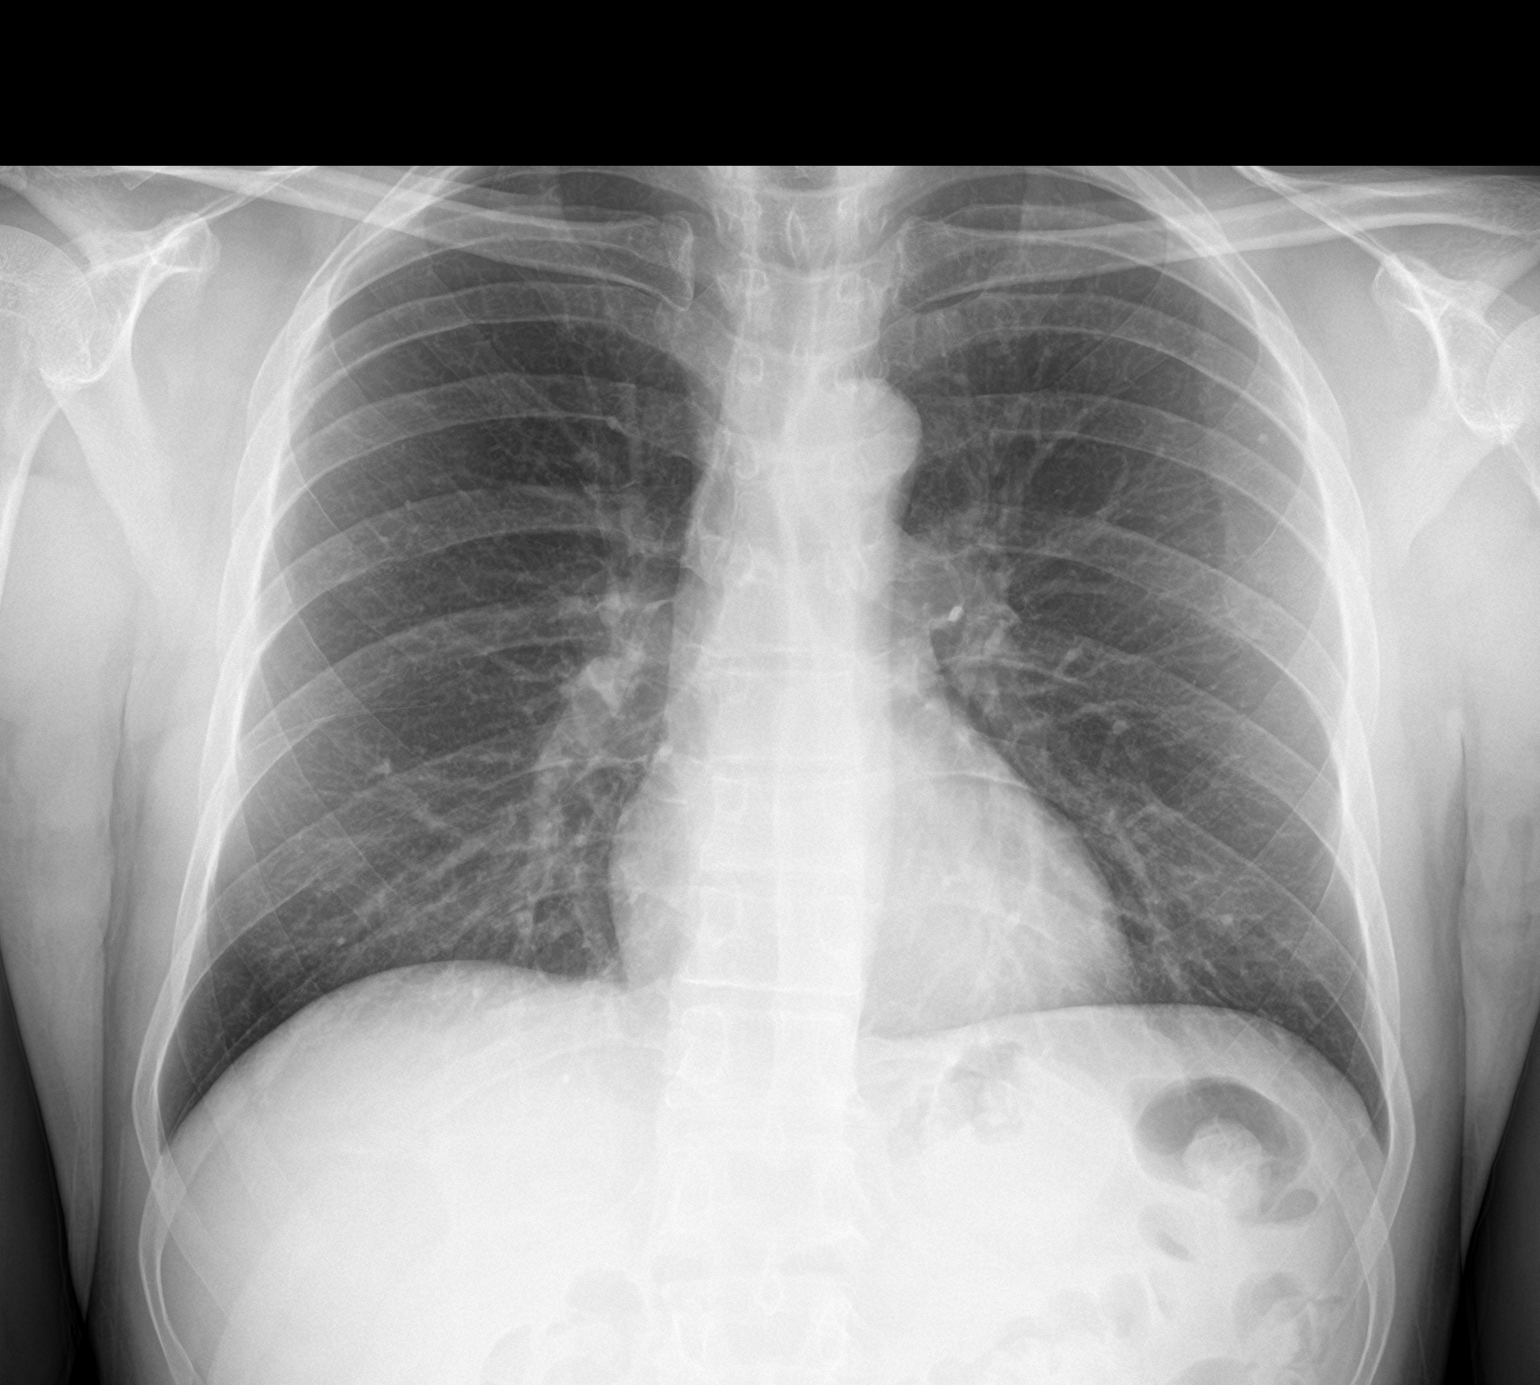

[rib pa]
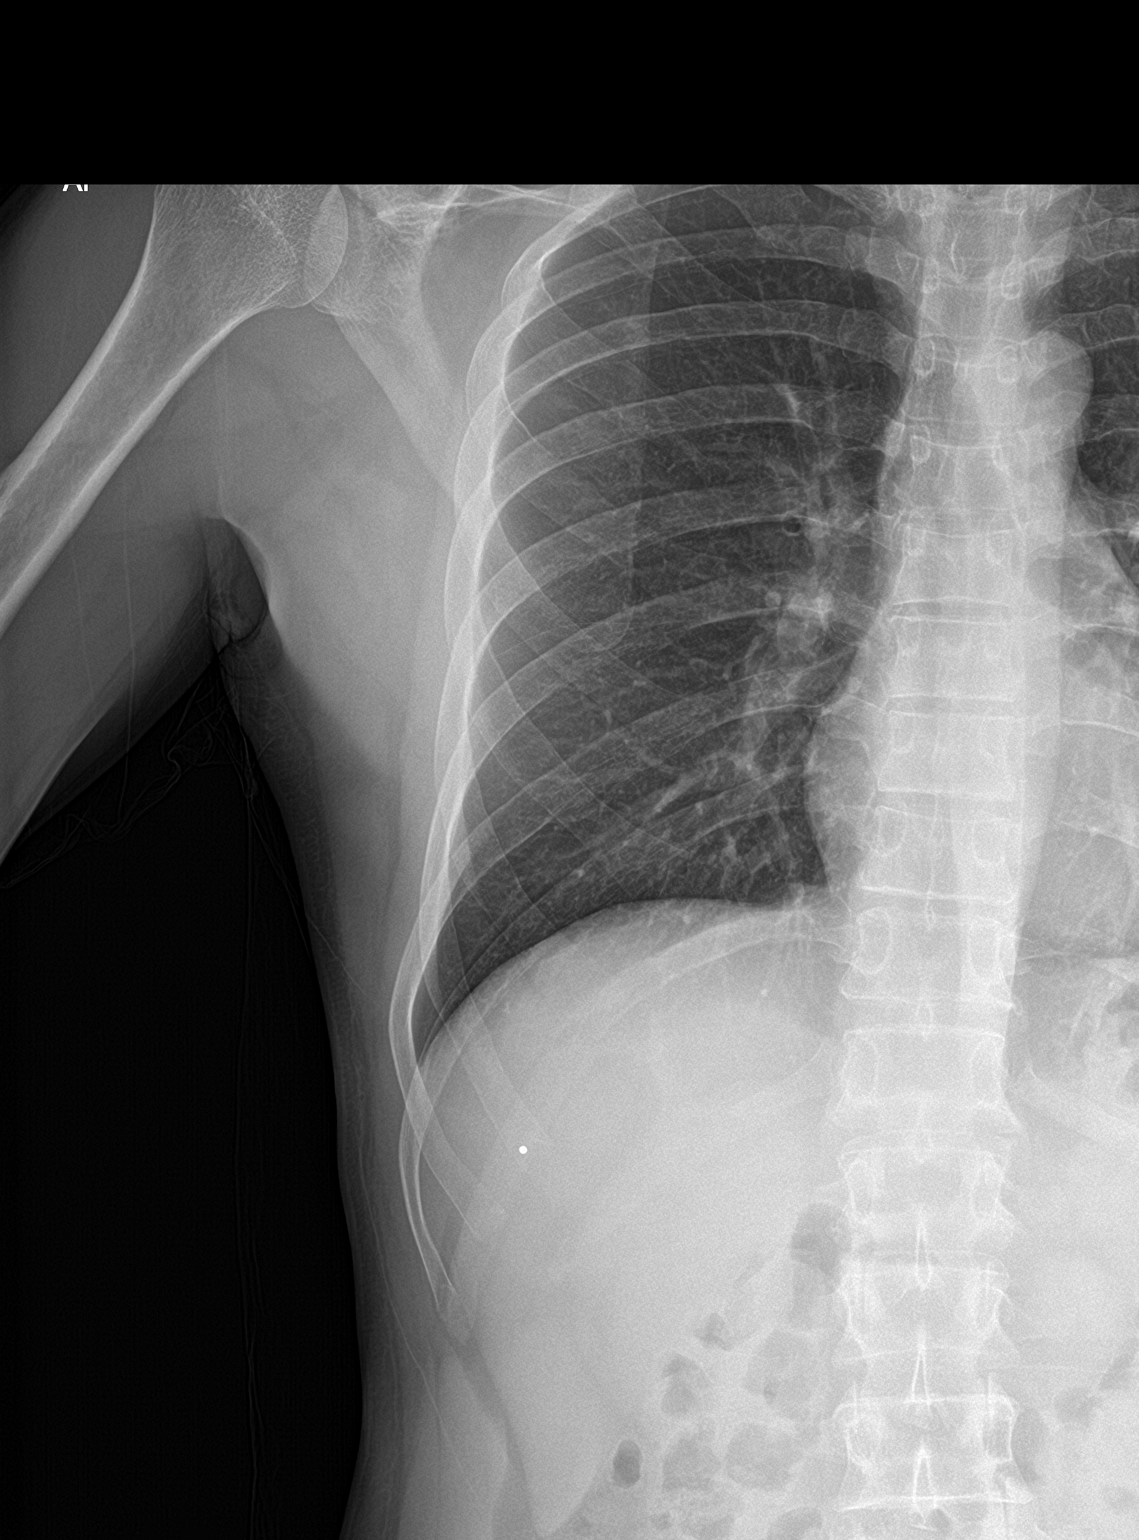

[rib obl]
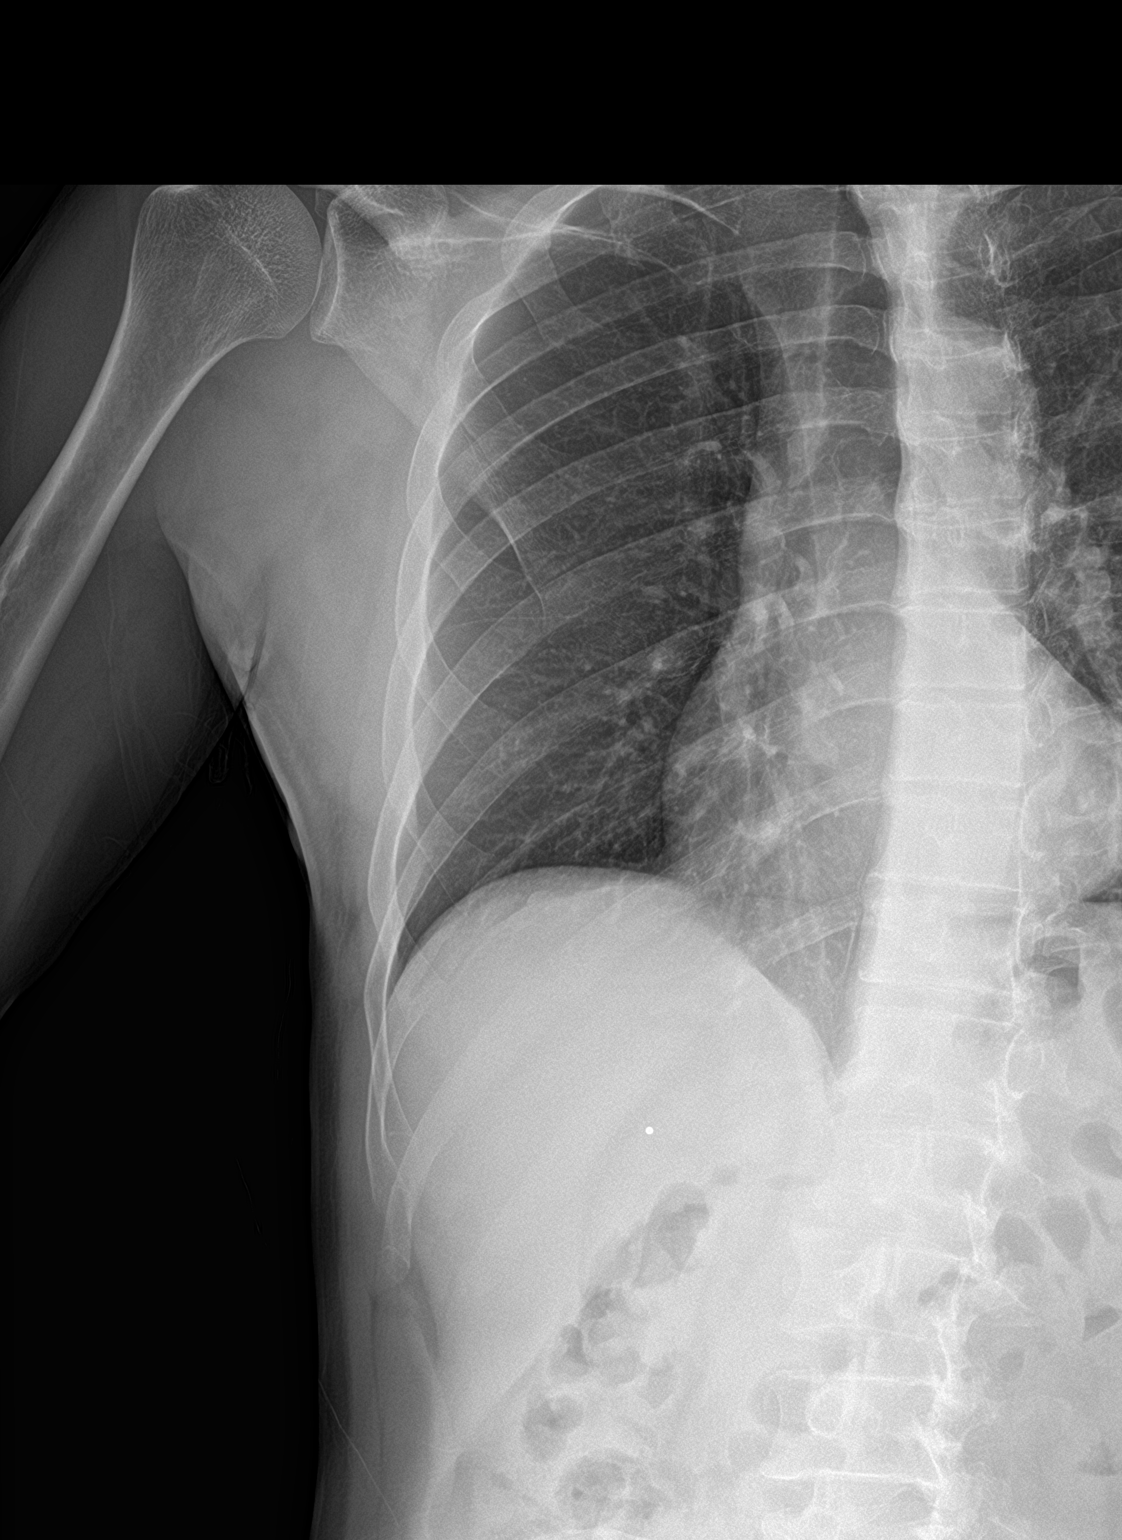

[3 of 3 positions shown; findings below may reference images not displayed]

FINDINGS: Normal sized heart. Small calcified granuloma in the left upper lung
zone. Otherwise, clear lungs. No rib fracture or pneumothorax seen.
IMPRESSION: No acute abnormality.

## 2020-11-08 DIAGNOSIS — R7303 Prediabetes: Secondary | ICD-10-CM

## 2020-11-08 DIAGNOSIS — E785 Hyperlipidemia, unspecified: Secondary | ICD-10-CM

## 2020-11-08 HISTORY — DX: Hyperlipidemia, unspecified: E78.5

## 2020-11-08 HISTORY — DX: Prediabetes: R73.03

## 2020-11-13 ENCOUNTER — Ambulatory Visit: Payer: Self-pay

## 2021-03-05 ENCOUNTER — Ambulatory Visit (INDEPENDENT_AMBULATORY_CARE_PROVIDER_SITE_OTHER): Payer: Self-pay | Admitting: Internal Medicine

## 2021-03-05 ENCOUNTER — Encounter: Payer: Self-pay | Admitting: Internal Medicine

## 2021-03-05 VITALS — BP 130/97 | HR 84 | Temp 98.4°F | Wt 138.8 lb

## 2021-03-05 DIAGNOSIS — Z13220 Encounter for screening for lipoid disorders: Secondary | ICD-10-CM

## 2021-03-05 DIAGNOSIS — Z0000 Encounter for general adult medical examination without abnormal findings: Secondary | ICD-10-CM

## 2021-03-05 DIAGNOSIS — R7303 Prediabetes: Secondary | ICD-10-CM

## 2021-03-05 DIAGNOSIS — R030 Elevated blood-pressure reading, without diagnosis of hypertension: Secondary | ICD-10-CM

## 2021-03-05 DIAGNOSIS — Z131 Encounter for screening for diabetes mellitus: Secondary | ICD-10-CM

## 2021-03-05 DIAGNOSIS — Z125 Encounter for screening for malignant neoplasm of prostate: Secondary | ICD-10-CM

## 2021-03-05 DIAGNOSIS — Z8639 Personal history of other endocrine, nutritional and metabolic disease: Secondary | ICD-10-CM

## 2021-03-05 LAB — GLUCOSE, CAPILLARY: Glucose-Capillary: 105 mg/dL — ABNORMAL HIGH (ref 70–99)

## 2021-03-05 LAB — POCT GLYCOSYLATED HEMOGLOBIN (HGB A1C): Hemoglobin A1C: 5.8 % — AB (ref 4.0–5.6)

## 2021-03-05 NOTE — Progress Notes (Signed)
   CC: Annual wellness visit  HPI:  [name] is a 55 y.o. with a past medical history listed below presenting for annual visit.  States that he has a family history of diabetes and would like his hemoglobin A1c checked today. For details of today's visit and the status of his chronic medical issues please refer to the assessment and plan.   Past Medical History:  Diagnosis Date  . 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 History of kidney stones    2016  . Rectal polyp    Review of Systems:  Review of Systems  Respiratory: Negative for cough, shortness of breath and wheezing.   Cardiovascular: Negative for chest pain, palpitations and leg swelling.  Gastrointestinal: Negative for abdominal pain, constipation, diarrhea, nausea and vomiting.  Neurological: Negative for dizziness, weakness and headaches.     Physical Exam:  Vitals:   03/05/21 0927  BP: (!) 120/102  Pulse: 84  Temp: 98.4 F (36.9 C)  TempSrc: Oral  SpO2: 99%  Weight: 138 lb 12.8 oz (63 kg)   Physical Exam Vitals reviewed.  Constitutional:      Appearance: Normal appearance.  Cardiovascular:     Rate and Rhythm: Normal rate and regular rhythm.     Pulses: Normal pulses.     Heart sounds: Normal heart sounds. No murmur heard. No friction rub. No gallop.   Pulmonary:     Effort: Pulmonary effort is normal. No respiratory distress.     Breath sounds: Normal breath sounds. No wheezing or rales.  Abdominal:     General: Abdomen is flat. Bowel sounds are normal. There is no distension.     Palpations: Abdomen is soft.     Tenderness: There is no abdominal tenderness. There is no guarding.  Musculoskeletal:        General: No swelling or tenderness.     Right lower leg: No edema.     Left lower leg: No edema.  Skin:    General: Skin is warm and dry.  Neurological:     Mental Status: He is alert and oriented to person, place, and time.     Motor: No weakness.   Psychiatric:        Mood and Affect: Mood normal.        Behavior: Behavior normal.        Thought Content: Thought content normal.        Judgment: Judgment normal.     Assessment & Plan:   See Encounters Tab for problem based charting.  Patient discussed with Dr. Evette Doffing

## 2021-03-05 NOTE — Assessment & Plan Note (Addendum)
Lipid profile unremarkable. PSA within normal limits. Hemoglobin A1c 5.8, see prediabetes tab

## 2021-03-05 NOTE — Patient Instructions (Addendum)
Thank you for allowing [place] to provide your care today. Today we discussed your healthcare maintenance.   I have ordered labs for you. I will call if any are abnormal.    Please follow-up in 2 months for BP check.    Should you have any questions or concerns please call the internal medicine clinic at [phone].     Diabetes Care, 44(Suppl 1), H85-I77. [email or web address]/[email or web address]  Prediabetes Prediabetes is when your blood sugar (blood glucose) level is higher than normal but not high enough for you to be diagnosed with type 2 diabetes. Having prediabetes puts you at risk for developing type 2 diabetes (type 2 diabetes mellitus). With certain lifestyle changes, you may be able to prevent or delay the onset of type 2 diabetes. This is important because type 2 diabetes can lead to serious complications, such as:  Heart disease.  Stroke.  Blindness.  Kidney disease.  Depression.  Poor circulation in the feet and legs. In severe cases, this could lead to surgical removal of a leg (amputation). What are the causes? The exact cause of prediabetes is not known. It may result from insulin resistance. Insulin resistance develops when cells in the body do not respond properly to insulin that the body makes. This can cause excess glucose to build up in the blood. High blood glucose (hyperglycemia) can develop. What increases the risk? The following factors may make you more likely to develop this condition:  You have a family member with type 2 diabetes.  You are older than 45 years.  You had a temporary form of diabetes during a pregnancy (gestational diabetes).  You had polycystic ovary syndrome (PCOS).  You are overweight or obese.  You are inactive (sedentary).  You have a history of heart disease, including problems with cholesterol levels, high levels of blood fats, or high blood pressure. What are the signs or symptoms? You may have no symptoms. If you  do have symptoms, they may include:  Increased hunger.  Increased thirst.  Increased urination.  Vision changes, such as blurry vision.  Tiredness (fatigue). How is this diagnosed? This condition can be diagnosed with blood tests. Your blood glucose may be checked with one or more of the following tests:  A fasting blood glucose (FBG) test. You will not be allowed to eat (you will fast) for at least 8 hours before a blood sample is taken.  An A1C blood test (hemoglobin A1C). This test provides information about blood glucose levels over the previous 2?3 months.  An oral glucose tolerance test (OGTT). This test measures your blood glucose at two points in time: ? After fasting. This is your baseline level. ? Two hours after you drink a beverage that contains glucose. You may be diagnosed with prediabetes if:  Your FBG is 100?125 mg/dL (5.6-6.9 mmol/L).  Your A1C level is 5.7?6.4% (39-46 mmol/mol).  Your OGTT result is 140?199 mg/dL (7.8-11 mmol/L). These blood tests may be repeated to confirm your diagnosis.   How is this treated? Treatment may include dietary and lifestyle changes to help lower your blood glucose and prevent type 2 diabetes from developing. In some cases, medicine may be prescribed to help lower the risk of type 2 diabetes. Follow these instructions at home: Nutrition  Follow a healthy meal plan. This includes eating lean proteins, whole grains, legumes, fresh fruits and vegetables, low-fat dairy products, and healthy fats.  Follow instructions from your health care provider about eating or drinking restrictions.  Meet with a dietitian to  create a healthy eating plan that is right for you.   Lifestyle  Do moderate-intensity exercise for at least 30 minutes a day on 5 or more days each week, or as told by your health care provider. A mix of activities may be best, such as: ? Brisk walking, swimming, biking, and weight lifting.  Lose weight as told by your  health care provider. Losing 5-7% of your body weight can reverse insulin resistance.  Do not drink alcohol if: ? Your health care provider tells you not to drink. ? You are pregnant, may be pregnant, or are planning to become pregnant.  If you drink alcohol: ? Limit how much you use to:  0-1 drink a day for women.  0-2 drinks a day for men. ? Be aware of how much alcohol is in your drink. In the [place] one drink equals one 12 oz bottle of beer (355 mL), one 5 oz glass of wine (148 mL), or one 1 oz glass of hard liquor (44 mL). General instructions  Take over-the-counter and prescription medicines only as told by your health care provider. You may be prescribed medicines that help lower the risk of type 2 diabetes.  Do not use any products that contain nicotine or tobacco, such as cigarettes, e-cigarettes, and chewing tobacco. If you need help quitting, ask your health care provider.  Keep all follow-up visits. This is important. Where to find more information  American Diabetes Association: www.diabetes.org  Academy of Nutrition and Dietetics: www.eatright.org  American Heart Association: www.heart.org Contact a health care provider if:  You have any of these symptoms: ? Increased hunger. ? Increased urination. ? Increased thirst. ? Fatigue. ? Vision changes, such as blurry vision. Get help right away if you:  Have shortness of breath.  Feel confused.  Vomit or feel like you may vomit. Summary  Prediabetes is when your blood sugar (blood glucose)level is higher than normal but not high enough for you to be diagnosed with type 2 diabetes.  Having prediabetes puts you at risk for developing type 2 diabetes (type 2 diabetes mellitus).  Make lifestyle changes such as eating a healthy diet and exercising regularly to help prevent diabetes. Lose weight as told by your health care provider. This information is not intended to replace advice given to you by your health care  provider. Make sure you discuss any questions you have with your health care provider. Document Revised: 01/24/2020 Document Reviewed: 01/24/2020 Elsevier Patient Education  [place].

## 2021-03-06 ENCOUNTER — Encounter: Payer: Self-pay | Admitting: Internal Medicine

## 2021-03-06 DIAGNOSIS — R7303 Prediabetes: Secondary | ICD-10-CM | POA: Insufficient documentation

## 2021-03-06 LAB — LIPID PANEL
Chol/HDL Ratio: 3.6 ratio (ref 0.0–5.0)
Cholesterol, Total: 200 mg/dL — ABNORMAL HIGH (ref 100–199)
HDL: 56 mg/dL (ref >39)
LDL Chol Calc (NIH): 132 mg/dL — ABNORMAL HIGH (ref 0–99)
Triglycerides: 66 mg/dL (ref 0–149)
VLDL Cholesterol Cal: 12 mg/dL (ref 5–40)

## 2021-03-06 LAB — PSA: Prostate Specific Ag, Serum: 1.8 ng/mL (ref 0.0–4.0)

## 2021-03-06 LAB — VITAMIN D 25 HYDROXY (VIT D DEFICIENCY, FRACTURES): Vit D, 25-Hydroxy: 32.3 ng/mL (ref 30.0–100.0)

## 2021-03-06 NOTE — Progress Notes (Signed)
Internal Medicine Clinic Attending ° °Case discussed with Dr. Rehman  At the time of the visit.  We reviewed the resident’s history and exam and pertinent patient test results.  I agree with the assessment, diagnosis, and plan of care documented in the resident’s note.  ° °

## 2021-03-06 NOTE — Assessment & Plan Note (Signed)
BP Readings from Last 3 Encounters:  03/05/21 (!) 130/97  11/20/19 (!) 136/97  09/07/19 128/88   No history of hypertension.  Blood pressure slightly elevated today.  Recommend continuing to monitor blood pressure and if it remains elevated may need to consider antihypertensives in the future.  Plan: Monitor blood pressure

## 2021-03-06 NOTE — Assessment & Plan Note (Signed)
Hemoglobin A1c 5.8 today.  Patient reports a family history of diabetes on his paternal side.  Overall, endorses a healthy lifestyle with physical activity.  Discussed risk factors as well as things that he can do to prevent progression of disease.  Denies any smoking.  He is not overweight.  He has had a few elevated blood pressure readings but no formal diagnosis of hypertension at this point.  Will recheck hemoglobin A1c in 1 year.

## 2021-03-06 NOTE — Assessment & Plan Note (Signed)
History of vitamin D deficiency.  Recheck vitamin D levels, 32.  Borderline low.  Recommend he continue vitamin D supplements.

## 2021-05-12 ENCOUNTER — Encounter: Payer: Self-pay | Admitting: *Deleted

## 2022-05-27 ENCOUNTER — Encounter: Payer: Self-pay | Admitting: Internal Medicine

## 2022-06-07 ENCOUNTER — Ambulatory Visit: Payer: Self-pay | Admitting: Internal Medicine

## 2022-06-07 ENCOUNTER — Encounter: Payer: Self-pay | Admitting: Internal Medicine

## 2022-06-07 VITALS — BP 130/86 | HR 76 | Resp 16 | Ht 63.0 in | Wt 137.0 lb

## 2022-06-07 DIAGNOSIS — M7711 Lateral epicondylitis, right elbow: Secondary | ICD-10-CM

## 2022-06-07 DIAGNOSIS — D128 Benign neoplasm of rectum: Secondary | ICD-10-CM

## 2022-06-07 MED ORDER — IBUPROFEN 200 MG PO TABS: ORAL_TABLET | ORAL | 0 refills | Status: DC

## 2022-06-07 NOTE — Patient Instructions (Signed)
Lateral epicondylitis or tennis elbow arm strap with compression pad to wear during day Elbow pad for sleep

## 2022-06-07 NOTE — Progress Notes (Signed)
Subjective:    Patient ID: Caleb Bryan, male   DOB: [birth year], 56 y.o.   MRN: [id number]   HPI  Here to establish.  Karen Kays interprets   Right elbow pain:  has had for 1-2 months.  Points to lateral elbow at epicondylar area.  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  He is also constantly supinating/pronating with screwing things in or unscrewing.  Has a problem even lifting light objects with his right hand.  No numbness or tingling in hand.  Has not taken anything for the pain.  2.  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  He has not had repeat colonoscopy since, though states he received a letter in the mail recently to get it done soon. Appears that Dr. Penelope Coop performed the colonoscopy previously.    No outpatient medications have been marked as taking for the 06/07/22 encounter (Office Visit) with Mack Hook, MD.   No Known Allergies  Past Medical History:  Diagnosis Date   Fatigue 08/01/2019   History of adenomatous polyp of colon    04-21-2017 TUBULAR ADENOMA   History of kidney stones    2016   Rectal polyp    Right calcaneal fracture 2016   Right fibular fracture 1985    Past Surgical History:  Procedure Laterality Date   COLONOSCOPY WITH PROPOFOL N/A 04/21/2017   Procedure: COLONOSCOPY WITH PROPOFOL;  Surgeon: Wonda Horner, MD;  [facility] ENDOSCOPY;  Service: Endoscopy;  Laterality: N/A;   LIPOMA EXCISION N/A 06/16/2017   Procedure: TRANSANAL EXCISION OF RECTAL POLYP;  Surgeon: Leighton Ruff, MD;  Location: [place];  Service: General;  Laterality: N/A;   Family History  Problem Relation Age of Onset   Hypertension Mother    Diabetes Father    Asthma Daughter    Social History   Socioeconomic History   Marital status: Married    Spouse name: Myna Hidalgo   Number of children: 4    Years of education: 13   Highest education level: Some college, no degree  Occupational History   Occupation: Clinical biochemist  Tobacco Use   Smoking status: Some Days    Types: Cigarettes    Passive exposure: Current   Smokeless tobacco: Never   Tobacco comments:    Still smoking a cigarette once monthly (2023)  Vaping Use   Vaping Use: Never used  Substance and Sexual Activity   Alcohol use: Yes    Comment: Occasional   Drug use: No   Sexual activity: Not on file  Other Topics Concern   Not on file  Social History Narrative   Originally from [place]   Came to [place] at home with wife and his two youngest children   Older two children have their own homes in [place].    Social Determinants of Health   Financial Resource Strain: Low Risk  (06/07/2022)   Overall Financial Resource Strain (CARDIA)    Difficulty of Paying Living Expenses: Not hard at all  Food Insecurity: No Food Insecurity (06/07/2022)   Hunger Vital Sign    Worried About Running Out of Food in the Last Year: Never true    Ran Out of Food in the Last Year: Never true  Transportation Needs: No Transportation Needs (06/07/2022)   PRAPARE - Hydrologist (Medical):  No    Lack of Transportation (Non-Medical): No  Physical Activity: Not on file  Stress: Not on file  Social Connections: Not on file  Intimate Partner Violence: Not At Risk (06/07/2022)   Humiliation, Afraid, Rape, and Kick questionnaire    Fear of Current or Ex-Partner: No    Emotionally Abused: No    Physically Abused: No    Sexually Abused: No      Review of Systems    Objective:   BP 130/86 (BP Location: Left Arm, Patient Position: Sitting, Cuff Size: Normal)   Pulse 76   Resp 16   Ht '5\' 3"'$  (1.6 m)   Wt 137 lb (62.1 kg)   BMI 24.27 kg/m   Physical Exam NAD Lungs:  CTA CV:  RRR with normal S1 and S2, No S3, S4 or murmur.  No carotid bruits.  Carotid, radial and DP pulses normal and equal Abd:  S,  NT, No HSM or mass, + BS MS:  pain at lateral elbow with supination against opposing force.  Tender over tendon as inserts at lateral epicondyle, right elbow.  Full ROM, but painful with supination/pronation   Assessment & Plan   Right lateral epicondylitis:  arm cast air splint to wear during day and cushioned elbow pad as tolerated during day and more significantly at night.  Ibuprofen 600 to 800 mg twice daily with meal twice daily for 14 days and then as needed.   Rectal villous adenoma:  to call GI and get scheduled for repeat colonoscopy.

## 2022-06-18 ENCOUNTER — Other Ambulatory Visit: Payer: Self-pay

## 2022-06-18 DIAGNOSIS — R7303 Prediabetes: Secondary | ICD-10-CM

## 2022-06-18 DIAGNOSIS — Z8639 Personal history of other endocrine, nutritional and metabolic disease: Secondary | ICD-10-CM

## 2022-06-18 DIAGNOSIS — Z7689 Persons encountering health services in other specified circumstances: Secondary | ICD-10-CM

## 2022-06-19 LAB — COMPREHENSIVE METABOLIC PANEL
ALT: 13 IU/L (ref 0–44)
AST: 23 IU/L (ref 0–40)
Albumin/Globulin Ratio: 2.2 (ref 1.2–2.2)
Albumin: 4.6 g/dL (ref 3.8–4.9)
Alkaline Phosphatase: 51 IU/L (ref 44–121)
BUN/Creatinine Ratio: 19 (ref 9–20)
BUN: 20 mg/dL (ref 6–24)
Bilirubin Total: 2.1 mg/dL — ABNORMAL HIGH (ref 0.0–1.2)
CO2: 23 mmol/L (ref 20–29)
Calcium: 9.3 mg/dL (ref 8.7–10.2)
Chloride: 102 mmol/L (ref 96–106)
Creatinine, Ser: 1.03 mg/dL (ref 0.76–1.27)
Globulin, Total: 2.1 g/dL (ref 1.5–4.5)
Glucose: 94 mg/dL (ref 70–99)
Potassium: 4.1 mmol/L (ref 3.5–5.2)
Sodium: 137 mmol/L (ref 134–144)
Total Protein: 6.7 g/dL (ref 6.0–8.5)
eGFR: 84 mL/min/{1.73_m2} (ref >59)

## 2022-06-19 LAB — LIPID PANEL W/O CHOL/HDL RATIO
Cholesterol, Total: 193 mg/dL (ref 100–199)
HDL: 61 mg/dL (ref >39)
LDL Chol Calc (NIH): 123 mg/dL — ABNORMAL HIGH (ref 0–99)
Triglycerides: 47 mg/dL (ref 0–149)
VLDL Cholesterol Cal: 9 mg/dL (ref 5–40)

## 2022-06-19 LAB — CBC WITH DIFFERENTIAL/PLATELET
Basophils Absolute: 0.1 10*3/uL (ref 0.0–0.2)
Basos: 1 %
EOS (ABSOLUTE): 0.1 10*3/uL (ref 0.0–0.4)
Eos: 2 %
Hematocrit: 45.2 % (ref 37.5–51.0)
Hemoglobin: 15.5 g/dL (ref 13.0–17.7)
Immature Grans (Abs): 0 10*3/uL (ref 0.0–0.1)
Immature Granulocytes: 0 %
Lymphocytes Absolute: 1.5 10*3/uL (ref 0.7–3.1)
Lymphs: 32 %
MCH: 30.6 pg (ref 26.6–33.0)
MCHC: 34.3 g/dL (ref 31.5–35.7)
MCV: 89 fL (ref 79–97)
Monocytes Absolute: 0.5 10*3/uL (ref 0.1–0.9)
Monocytes: 10 %
Neutrophils Absolute: 2.6 10*3/uL (ref 1.4–7.0)
Neutrophils: 55 %
Platelets: 257 10*3/uL (ref 150–450)
RBC: 5.07 x10E6/uL (ref 4.14–5.80)
RDW: 12.2 % (ref 11.6–15.4)
WBC: 4.7 10*3/uL (ref 3.4–10.8)

## 2022-06-19 LAB — PSA: Prostate Specific Ag, Serum: 1.7 ng/mL (ref 0.0–4.0)

## 2022-06-19 LAB — HEMOGLOBIN A1C
Est. average glucose Bld gHb Est-mCnc: 117 mg/dL
Hgb A1c MFr Bld: 5.7 % — ABNORMAL HIGH (ref 4.8–5.6)

## 2022-08-05 ENCOUNTER — Encounter: Payer: Self-pay | Admitting: Internal Medicine

## 2022-08-05 ENCOUNTER — Ambulatory Visit: Payer: Self-pay | Admitting: Internal Medicine

## 2022-08-05 VITALS — BP 118/82 | HR 76 | Resp 16 | Ht 63.0 in | Wt 133.0 lb

## 2022-08-05 DIAGNOSIS — Z0000 Encounter for general adult medical examination without abnormal findings: Secondary | ICD-10-CM

## 2022-08-05 DIAGNOSIS — R17 Unspecified jaundice: Secondary | ICD-10-CM

## 2022-08-05 DIAGNOSIS — R7303 Prediabetes: Secondary | ICD-10-CM

## 2022-08-05 DIAGNOSIS — E7800 Pure hypercholesterolemia, unspecified: Secondary | ICD-10-CM

## 2022-08-05 DIAGNOSIS — D128 Benign neoplasm of rectum: Secondary | ICD-10-CM

## 2022-08-05 DIAGNOSIS — Z23 Encounter for immunization: Secondary | ICD-10-CM

## 2022-08-05 NOTE — Progress Notes (Signed)
Subjective:    Patient ID: Caleb Bryan, male   DOB: [birth year], 56 y.o.   MRN: [id number]   HPI  Mariah Milling interprets  Here for Male CPE:   1.  STE:  Does not perform.  No family history of testicular cancer.    2.  PSA: 1.7 06/18/22, normal.  No family history of prostate cancer.    3.  [place] Cards/FIT:  Never.    4.  Colonoscopy:  Large rectal polyp found in 04/2017 with Dr. Penelope Coop.  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  He received a letter to set up a follow up colonoscopy in June, but has not yet called to set up appt.  He was not certain if [facility] would set him up with the re evaluation or not.  5.  Cholesterol/Glucose:  Cholesterol improved somewhat improved with total and LDL from the year previous.  HDL also a bit higher.  History of prediabetes, with A1C last month decreased to 5.7%.  Describes a healthy diet.   Lipid Panel     Component Value Date/Time   CHOL 193 06/18/2022 0835   TRIG 47 06/18/2022 0835   HDL 61 06/18/2022 0835   CHOLHDL 3.6 03/05/2021 1005   LDLCALC 123 (H) 06/18/2022 0835   LABVLDL 9 06/18/2022 0835      6.  Immunizations:  Has not had influenza vaccine this year.  Feels he does not need despite counseling.   Has not had shingles vaccine yet.   Immunization History  Administered Date(s) Administered   Influenza,inj,Quad PF,6+ Mos 08/17/2017, 07/11/2019   PFIZER(Purple Top)SARS-COV-2 Vaccination 02/05/2020, 04/08/2020   Tdap 05/31/2017      7.  Other:  Tbil elevated to 2.1 with labs in August.  No other hepatic serology was abnormal.    No outpatient medications have been marked as taking for the 08/05/22 encounter (Office Visit) with Mack Hook, MD.   No Known Allergies  Past Medical History:  Diagnosis Date   Fatigue 08/01/2019   History of adenomatous polyp of colon    04-21-2017 TUBULAR ADENOMA   History of kidney  stones    2016   Hyperlipidemia 2022   Prediabetes 2022   Rectal polyp    Right calcaneal fracture 2016   Right fibular fracture 1985   Past Surgical History:  Procedure Laterality Date   COLONOSCOPY WITH PROPOFOL N/A 04/21/2017   Procedure: COLONOSCOPY WITH PROPOFOL;  Surgeon: Wonda Horner, MD;  Location: [facility] ENDOSCOPY;  Service: Endoscopy;  Laterality: N/A;   LIPOMA EXCISION N/A 06/16/2017   Procedure: TRANSANAL EXCISION OF RECTAL POLYP;  Surgeon: Leighton Ruff, MD;  Location: [place];  Service: General;  Laterality: N/A;   Family History  Problem Relation Age of Onset   Hypertension Mother    Diabetes Father    Asthma Daughter    Social History   Socioeconomic History   Marital status: Married    Spouse name: Myna Hidalgo   Number of children: 4   Years of education: 13   Highest education level: Some college, no degree  Occupational History   Occupation: Clinical biochemist  Tobacco Use   Smoking status: Former    Types: Cigarettes    Passive exposure: Current   Smokeless tobacco: Never   Tobacco comments:    Still smoking a cigarette once monthly (05/2022)--stopped subsequently 07/2022  Vaping Use   Vaping Use: Never  used  Substance and Sexual Activity   Alcohol use: Yes    Comment: Occasional   Drug use: No   Sexual activity: Yes    Birth control/protection: Post-menopausal  Other Topics Concern   Not on file  Social History Narrative   Originally from [place]   Came to [place] at home with wife and his two youngest children   Older two children have their own homes in [place].    Social Determinants of Health   Financial Resource Strain: Low Risk  (06/07/2022)   Overall Financial Resource Strain (CARDIA)    Difficulty of Paying Living Expenses: Not hard at all  Food Insecurity: No Food Insecurity (06/07/2022)   Hunger Vital Sign    Worried About Running Out of Food in the Last Year: Never true    Ran Out of Food in the Last Year: Never  true  Transportation Needs: No Transportation Needs (06/07/2022)   PRAPARE - Hydrologist (Medical): No    Lack of Transportation (Non-Medical): No  Physical Activity: Not on file  Stress: Not on file  Social Connections: Not on file  Intimate Partner Violence: Not At Risk (06/07/2022)   Humiliation, Afraid, Rape, and Kick questionnaire    Fear of Current or Ex-Partner: No    Emotionally Abused: No    Physically Abused: No    Sexually Abused: No     Review of Systems  HENT:  Positive for dental problem (Is going to dental clinic already through [facility].).   Eyes:  Positive for visual disturbance (Has eye appt for new glasses next week.).  Respiratory:  Negative for shortness of breath.   Cardiovascular:  Negative for chest pain, palpitations and leg swelling.  Musculoskeletal:  Positive for arthralgias (knees at times).       Right lateral epicondylitis:  Feeling better--gradually improving.   Bought a brace that covers his whole elbow Not clear he obtained a velcro strap or elbow pad as recommended.  Neurological:  Negative for weakness and numbness.  Psychiatric/Behavioral:  Negative for dysphoric mood. The patient is not nervous/anxious.       Objective:   BP 118/82 (BP Location: Left Arm, Patient Position: Sitting, Cuff Size: Normal)   Pulse 76   Resp 16   Ht '5\' 3"'$  (1.6 m)   Wt 133 lb (60.3 kg)   BMI 23.56 kg/m   Physical Exam Constitutional:      Appearance: He is normal weight.  HENT:     Head: Normocephalic and atraumatic.     Right Ear: Tympanic membrane, ear canal and external ear normal.     Left Ear: Tympanic membrane, ear canal and external ear normal.     Nose: Nose normal.     Mouth/Throat:     Mouth: Mucous membranes are moist.     Pharynx: Oropharynx is clear.  Eyes:     Extraocular Movements: Extraocular movements intact.     Conjunctiva/sclera: Conjunctivae normal.     Pupils: Pupils are equal, round, and reactive  to light.     Comments: Discs sharp  Neck:     Thyroid: No thyroid mass or thyromegaly.  Cardiovascular:     Rate and Rhythm: Normal rate and regular rhythm.     Heart sounds: S1 normal and S2 normal. No murmur heard.    No friction rub. No S3 or S4 sounds.     Comments: No carotid bruits.  Carotid, radial, femoral, DP and  PT pulses normal and equal.    Pulmonary:     Effort: Pulmonary effort is normal.     Breath sounds: Normal breath sounds and air entry.  Abdominal:     General: Abdomen is flat. Bowel sounds are normal.     Palpations: Abdomen is soft. There is no hepatomegaly, splenomegaly or mass.     Tenderness: There is no abdominal tenderness.     Hernia: No hernia is present.  Genitourinary:    Penis: Normal.      Testes: Normal.  Musculoskeletal:        General: Normal range of motion.     Cervical back: Normal range of motion and neck supple.     Right lower leg: No edema.     Left lower leg: No edema.  Lymphadenopathy:     Head:     Right side of head: No submental or submandibular adenopathy.     Left side of head: No submental or submandibular adenopathy.     Cervical: No cervical adenopathy.     Upper Body:     Right upper body: No supraclavicular or axillary adenopathy.     Left upper body: No supraclavicular or axillary adenopathy.     Lower Body: No right inguinal adenopathy. No left inguinal adenopathy.  Skin:    General: Skin is warm.     Capillary Refill: Capillary refill takes less than 2 seconds.     Findings: No rash.  Neurological:     General: No focal deficit present.     Mental Status: He is alert and oriented to person, place, and time.     Cranial Nerves: Cranial nerves 2-12 are intact.     Sensory: Sensation is intact.     Motor: Motor function is intact.     Coordination: Coordination is intact.     Gait: Gait is intact.     Deep Tendon Reflexes: Reflexes are normal and symmetric.  Psychiatric:        Speech: Speech normal.         Behavior: Behavior normal. Behavior is cooperative.      Assessment & Plan    CPE FIT to return in 2 weeks. Will see about going through [facility] for repeat colonoscopy. PSA normal in August. Shingrix vaccine.  2.  Prediabetes:  improving A1C at 5.7%  3.  Hypercholesterolemia:  good panel recently  4.  Rectal polyp:  as above, referral through [facility] to get repeat colonoscopy as previously done through them.  5.  Elevated Tbil:  hepatic profile.

## 2022-08-06 LAB — HEPATIC FUNCTION PANEL
ALT: 15 IU/L (ref 0–44)
AST: 21 IU/L (ref 0–40)
Albumin: 4.8 g/dL (ref 3.8–4.9)
Alkaline Phosphatase: 58 IU/L (ref 44–121)
Bilirubin Total: 0.9 mg/dL (ref 0.0–1.2)
Bilirubin, Direct: 0.21 mg/dL (ref 0.00–0.40)
Total Protein: 7 g/dL (ref 6.0–8.5)

## 2022-12-07 ENCOUNTER — Telehealth: Payer: Self-pay

## 2022-12-07 NOTE — Telephone Encounter (Signed)
[facility] informed us that they cannot do his GI referral. We need to send him to [place]

## 2023-02-03 ENCOUNTER — Ambulatory Visit: Payer: Self-pay | Admitting: Internal Medicine

## 2023-02-16 ENCOUNTER — Ambulatory Visit: Payer: Self-pay | Admitting: Internal Medicine

## 2023-02-16 DIAGNOSIS — Z23 Encounter for immunization: Secondary | ICD-10-CM

## 2023-05-31 ENCOUNTER — Other Ambulatory Visit: Payer: Self-pay | Admitting: Internal Medicine

## 2023-08-08 ENCOUNTER — Ambulatory Visit: Payer: Self-pay | Admitting: Internal Medicine

## 2023-08-08 ENCOUNTER — Encounter: Payer: Self-pay | Admitting: Internal Medicine

## 2023-08-08 VITALS — BP 126/78 | HR 72 | Resp 12 | Ht 63.0 in | Wt 138.0 lb

## 2023-08-08 DIAGNOSIS — Z0000 Encounter for general adult medical examination without abnormal findings: Secondary | ICD-10-CM

## 2023-08-08 DIAGNOSIS — R7303 Prediabetes: Secondary | ICD-10-CM

## 2023-08-08 DIAGNOSIS — E7800 Pure hypercholesterolemia, unspecified: Secondary | ICD-10-CM

## 2023-08-08 DIAGNOSIS — D128 Benign neoplasm of rectum: Secondary | ICD-10-CM

## 2023-08-08 NOTE — Progress Notes (Signed)
Subjective:    Patient ID: Caleb Bryan, male   DOB: [birth year], 57 y.o.   MRN: [id number]   HPI  Tereasa Coop interprets  Here for Male CPE:  1.  STE:  He does not perform.  No family history of testicular cancer.  2.  PSA:  Last checked 1 year ago and in normal range at 1.7.  No family history of prostate cancer.    3.  Guaiac Cards/FIT:  Not performed last year.  4.  Colonoscopy: Large rectal polyp found in 04/2017 with Dr. Evette Cristal. 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 He received a letter to set up a follow up colonoscopy in June of 2023, but had not called to set this up at his visit 1 year ago.  He now has the paperwork with Dr. Lorenso Quarry at [facility] for colonoscopy to be performed Oct. 29th, but his orange card has expired and he is not certain how much this will cost or if something can be done for the cost.  He apparently longer meets criteria for orange card.  Last applied 1.5 months ago.  5.  Cholesterol/Glucose:  Cholesterol was okay last fall.  He is slightly in prediabetic range past 2 years with last A1C at 5.7%.   Lipid Panel     Component Value Date/Time   CHOL 193 06/18/2022 0835   TRIG 47 06/18/2022 0835   HDL 61 06/18/2022 0835   CHOLHDL 3.6 03/05/2021 1005   LDLCALC 123 (H) 06/18/2022 0835   LABVLDL 9 06/18/2022 0835     6.  Immunizations: Due for influenza.  He is hesitant to obtain influenza vaccine.    No outpatient medications have been marked as taking for the 08/08/23 encounter (Office Visit) with Julieanne Manson, MD.   No Known Allergies  Past Medical History:  Diagnosis Date   Fatigue 08/01/2019   History of adenomatous polyp of colon    04-21-2017 TUBULAR ADENOMA   History of kidney stones    2016   Hyperlipidemia 2022   Prediabetes 2022   Rectal polyp    Right calcaneal fracture 2016   Right fibular fracture 1985   Past Surgical  History:  Procedure Laterality Date   COLONOSCOPY WITH PROPOFOL N/A 04/21/2017   Procedure: COLONOSCOPY WITH PROPOFOL;  Surgeon: Graylin Shiver, MD;  Location: [facility] ENDOSCOPY;  Service: Endoscopy;  Laterality: N/A;   LIPOMA EXCISION N/A 06/16/2017   Procedure: TRANSANAL EXCISION OF RECTAL POLYP;  Surgeon: Romie Levee, MD;  Location: [facility];  Service: General;  Laterality: N/A;   Family History  Problem Relation Age of Onset   Hypertension Mother    Diabetes Father    Asthma Daughter    Social History   Socioeconomic History   Marital status: Married    Spouse name: Wyatt Haste   Number of children: 4   Years of education: 13   Highest education level: Some college, no degree  Occupational History   Occupation: Personnel officer  Tobacco Use   Smoking status: Former    Current packs/day: 0.00    Average packs/day: 0.5 packs/day for 12.0 years (6.0 ttl pk-yrs)    Types: Cigarettes    Start date: 39    Quit date: 1995    Years since quitting: 29.7    Passive exposure: Current   Smokeless tobacco: Never   Tobacco comments:    Still smoking  a cigarette once monthly (05/2022)--stopped subsequently 07/2022  Vaping Use   Vaping status: Never Used  Substance and Sexual Activity   Alcohol use: Yes    Comment: Occasional   Drug use: No   Sexual activity: Yes    Birth control/protection: Post-menopausal  Other Topics Concern   Not on file  Social History Narrative   Originally from [place]   Came to [place] [employer] [place]. 1999   Lives at home with wife and his two youngest children   Older two children have their own homes in [place].    Social Determinants of Health   Financial Resource Strain: Low Risk  (06/07/2022)   Overall Financial Resource Strain (CARDIA)    Difficulty of Paying Living Expenses: Not hard at all  Food Insecurity: No Food Insecurity (08/08/2023)   Hunger Vital Sign    Worried About Running Out of Food in the Last Year: Never true    Ran Out of Food in the Last  Year: Never true  Transportation Needs: No Transportation Needs (08/08/2023)   PRAPARE - Administrator, Civil Service (Medical): No    Lack of Transportation (Non-Medical): No  Physical Activity: Not on file  Stress: Not on file  Social Connections: Not on file  Intimate Partner Violence: Not At Risk (08/08/2023)   Humiliation, Afraid, Rape, and Kick questionnaire    Fear of Current or Ex-Partner: No    Emotionally Abused: No    Physically Abused: No    Sexually Abused: No      Review of Systems  Respiratory:  Negative for shortness of breath.   Cardiovascular:  Negative for chest pain and leg swelling.  Gastrointestinal:  Negative for abdominal pain and blood in stool (No melena).  Neurological:  Negative for weakness and numbness.      Objective:   BP 126/78 (BP Location: Right Arm, Patient Position: Sitting, Cuff Size: Normal)   Pulse 72   Resp 12   Ht 5\' 3"  (1.6 m)   Wt 138 lb (62.6 kg)   BMI 24.45 kg/m   Physical Exam HENT:     Head: Normocephalic and atraumatic.     Right Ear: Tympanic membrane, ear canal and external ear normal.     Left Ear: Tympanic membrane, ear canal and external ear normal.     Nose: Nose normal.     Mouth/Throat:     Mouth: Mucous membranes are moist.     Pharynx: Oropharynx is clear.  Eyes:     Extraocular Movements: Extraocular movements intact.     Conjunctiva/sclera: Conjunctivae normal.     Pupils: Pupils are equal, round, and reactive to light.     Comments: Discs sharp.  Neck:     Thyroid: No thyroid mass or thyromegaly.  Cardiovascular:     Rate and Rhythm: Normal rate and regular rhythm.     Heart sounds: S1 normal and S2 normal. No murmur heard.    No friction rub. No S3 or S4 sounds.     Comments: No carotid bruits.  Carotid, radial, femoral, DP and PT pulses normal and equal.   Pulmonary:     Effort: Pulmonary effort is normal.     Breath sounds: Normal breath sounds and air entry.  Abdominal:      General: Bowel sounds are normal.     Palpations: Abdomen is soft. There is no hepatomegaly, splenomegaly or mass.     Tenderness: There is no abdominal tenderness.     Hernia: No hernia is  present. There is no hernia in the left inguinal area or right inguinal area.  Genitourinary:    Penis: Uncircumcised.      Testes:        Right: Mass or tenderness not present. Right testis is descended.        Left: Mass or tenderness not present. Left testis is descended.  Musculoskeletal:        General: Normal range of motion.     Cervical back: Normal range of motion and neck supple.     Right lower leg: No edema.     Left lower leg: No edema.  Lymphadenopathy:     Head:     Right side of head: No submental or submandibular adenopathy.     Left side of head: No submental or submandibular adenopathy.     Cervical: No cervical adenopathy.     Upper Body:     Right upper body: No supraclavicular adenopathy.     Left upper body: No supraclavicular adenopathy.     Lower Body: No right inguinal adenopathy. No left inguinal adenopathy.  Skin:    General: Skin is warm.     Capillary Refill: Capillary refill takes less than 2 seconds.          Comments: 2 cm subcutaneous soft tissue mass.  NT and moveable  Neurological:     General: No focal deficit present.     Mental Status: He is alert and oriented to person, place, and time.     Cranial Nerves: Cranial nerves 2-12 are intact.     Sensory: Sensation is intact.     Motor: Motor function is intact.     Coordination: Coordination is intact.     Gait: Gait is intact.     Deep Tendon Reflexes: Reflexes are normal and symmetric.  Psychiatric:        Attention and Perception: Attention normal.        Speech: Speech normal.        Behavior: Behavior normal. Behavior is cooperative.    Assessment & Plan   CPE Declined influenza  Encouraged checking back in when we have COVID vaccinations. Return for fasting labs in next 2 weeks, including  CBC, CMP, PSA, A1C, FLP  2.  Hx of Rectal adenomatous polyp with high grade dysplasia:  Has appt with [facility] for repeat colonoscopy.  To contact us if cannot proceed due to cost.  3.  Prediabetes/hypercholesterolemia:  labs above.

## 2023-08-25 ENCOUNTER — Other Ambulatory Visit: Payer: Self-pay

## 2023-08-25 ENCOUNTER — Other Ambulatory Visit: Payer: Self-pay | Admitting: Internal Medicine

## 2023-08-25 DIAGNOSIS — Z125 Encounter for screening for malignant neoplasm of prostate: Secondary | ICD-10-CM

## 2023-08-25 DIAGNOSIS — Z0000 Encounter for general adult medical examination without abnormal findings: Secondary | ICD-10-CM

## 2023-08-25 DIAGNOSIS — E7800 Pure hypercholesterolemia, unspecified: Secondary | ICD-10-CM

## 2023-08-25 DIAGNOSIS — R7303 Prediabetes: Secondary | ICD-10-CM

## 2023-08-26 LAB — COMPREHENSIVE METABOLIC PANEL
ALT: 13 [IU]/L (ref 0–44)
AST: 20 [IU]/L (ref 0–40)
Albumin: 4.2 g/dL (ref 3.8–4.9)
Alkaline Phosphatase: 53 [IU]/L (ref 44–121)
BUN/Creatinine Ratio: 13 (ref 9–20)
BUN: 14 mg/dL (ref 6–24)
Bilirubin Total: 1.2 mg/dL (ref 0.0–1.2)
CO2: 24 mmol/L (ref 20–29)
Calcium: 9.2 mg/dL (ref 8.7–10.2)
Chloride: 101 mmol/L (ref 96–106)
Creatinine, Ser: 1.04 mg/dL (ref 0.76–1.27)
Globulin, Total: 2.1 g/dL (ref 1.5–4.5)
Glucose: 95 mg/dL (ref 70–99)
Potassium: 4.4 mmol/L (ref 3.5–5.2)
Sodium: 139 mmol/L (ref 134–144)
Total Protein: 6.3 g/dL (ref 6.0–8.5)
eGFR: 83 mL/min/{1.73_m2} (ref >59)

## 2023-08-26 LAB — CBC WITH DIFFERENTIAL/PLATELET
Basophils Absolute: 0.1 10*3/uL (ref 0.0–0.2)
Basos: 1 %
EOS (ABSOLUTE): 0.1 10*3/uL (ref 0.0–0.4)
Eos: 2 %
Hematocrit: 47.3 % (ref 37.5–51.0)
Hemoglobin: 15.4 g/dL (ref 13.0–17.7)
Immature Grans (Abs): 0 10*3/uL (ref 0.0–0.1)
Immature Granulocytes: 0 %
Lymphocytes Absolute: 1.6 10*3/uL (ref 0.7–3.1)
Lymphs: 32 %
MCH: 29.7 pg (ref 26.6–33.0)
MCHC: 32.6 g/dL (ref 31.5–35.7)
MCV: 91 fL (ref 79–97)
Monocytes Absolute: 0.5 10*3/uL (ref 0.1–0.9)
Monocytes: 10 %
Neutrophils Absolute: 2.7 10*3/uL (ref 1.4–7.0)
Neutrophils: 55 %
Platelets: 290 10*3/uL (ref 150–450)
RBC: 5.18 x10E6/uL (ref 4.14–5.80)
RDW: 12 % (ref 11.6–15.4)
WBC: 4.9 10*3/uL (ref 3.4–10.8)

## 2023-08-26 LAB — LIPID PANEL W/O CHOL/HDL RATIO
Cholesterol, Total: 209 mg/dL — ABNORMAL HIGH (ref 100–199)
HDL: 55 mg/dL (ref >39)
LDL Chol Calc (NIH): 142 mg/dL — ABNORMAL HIGH (ref 0–99)
Triglycerides: 66 mg/dL (ref 0–149)
VLDL Cholesterol Cal: 12 mg/dL (ref 5–40)

## 2023-08-26 LAB — HEMOGLOBIN A1C
Est. average glucose Bld gHb Est-mCnc: 120 mg/dL
Hgb A1c MFr Bld: 5.8 % — ABNORMAL HIGH (ref 4.8–5.6)

## 2023-08-26 LAB — PSA: Prostate Specific Ag, Serum: 1.4 ng/mL (ref 0.0–4.0)

## 2023-08-31 ENCOUNTER — Encounter (HOSPITAL_COMMUNITY): Payer: Self-pay | Admitting: Internal Medicine

## 2023-08-31 NOTE — Progress Notes (Signed)
**  Using Interpretive Services**Attempted to obtain medical history for pre op call via telephone, unable to reach at this time. HIPAA compliant voicemail message left requesting return call to pre surgical testing department.

## 2023-09-06 ENCOUNTER — Ambulatory Visit (HOSPITAL_COMMUNITY): Admission: RE | Admit: 2023-09-06 | Payer: Self-pay | Source: Home / Self Care | Admitting: Internal Medicine

## 2023-09-06 ENCOUNTER — Encounter (HOSPITAL_COMMUNITY): Payer: Self-pay

## 2023-09-06 ENCOUNTER — Ambulatory Visit (HOSPITAL_COMMUNITY): Admit: 2023-09-06 | Payer: Self-pay | Admitting: Internal Medicine

## 2023-09-06 SURGERY — COLONOSCOPY
Anesthesia: Monitor Anesthesia Care

## 2023-09-06 SURGERY — COLONOSCOPY WITH PROPOFOL
Anesthesia: Monitor Anesthesia Care

## 2023-09-23 ENCOUNTER — Telehealth: Payer: Self-pay | Admitting: Internal Medicine

## 2023-09-28 NOTE — Telephone Encounter (Signed)
Patient reports that his colonoscopy was not cancelled. Procedure scheduled for 09/29/2023 at [facility]

## 2023-09-30 NOTE — Telephone Encounter (Signed)
Got it.

## 2023-12-20 ENCOUNTER — Ambulatory Visit: Payer: Self-pay | Admitting: Internal Medicine

## 2023-12-20 DIAGNOSIS — Z23 Encounter for immunization: Secondary | ICD-10-CM

## 2024-02-22 ENCOUNTER — Ambulatory Visit: Payer: Self-pay | Admitting: Internal Medicine

## 2024-02-22 DIAGNOSIS — Z23 Encounter for immunization: Secondary | ICD-10-CM

## 2024-02-22 NOTE — Addendum Note (Signed)
 Addended by: Alayssa Flinchum, [place] N on: 02/22/2024 05:07 PM   Modules accepted: Orders

## 2024-04-25 ENCOUNTER — Ambulatory Visit: Payer: Self-pay

## 2024-08-03 ENCOUNTER — Other Ambulatory Visit: Payer: Self-pay

## 2024-08-06 ENCOUNTER — Other Ambulatory Visit: Payer: Self-pay

## 2024-08-06 DIAGNOSIS — R7303 Prediabetes: Secondary | ICD-10-CM

## 2024-08-06 DIAGNOSIS — Z0000 Encounter for general adult medical examination without abnormal findings: Secondary | ICD-10-CM

## 2024-08-07 LAB — COMPREHENSIVE METABOLIC PANEL WITH GFR
ALT: 14 IU/L (ref 0–44)
AST: 19 IU/L (ref 0–40)
Albumin: 4.4 g/dL (ref 3.8–4.9)
Alkaline Phosphatase: 55 IU/L (ref 47–123)
BUN/Creatinine Ratio: 20 (ref 10–24)
BUN: 19 mg/dL (ref 8–27)
Bilirubin Total: 0.9 mg/dL (ref 0.0–1.2)
CO2: 25 mmol/L (ref 20–29)
Calcium: 9.2 mg/dL (ref 8.6–10.2)
Chloride: 99 mmol/L (ref 96–106)
Creatinine, Ser: 0.96 mg/dL (ref 0.76–1.27)
Globulin, Total: 2 g/dL (ref 1.5–4.5)
Glucose: 98 mg/dL (ref 70–99)
Potassium: 4.4 mmol/L (ref 3.5–5.2)
Sodium: 142 mmol/L (ref 134–144)
Total Protein: 6.4 g/dL (ref 6.0–8.5)
eGFR: 90 mL/min/1.73 (ref >59)

## 2024-08-07 LAB — LIPID PANEL
Chol/HDL Ratio: 3.1 ratio (ref 0.0–5.0)
Cholesterol, Total: 192 mg/dL (ref 100–199)
HDL: 62 mg/dL (ref >39)
LDL Chol Calc (NIH): 117 mg/dL — ABNORMAL HIGH (ref 0–99)
Triglycerides: 72 mg/dL (ref 0–149)
VLDL Cholesterol Cal: 13 mg/dL (ref 5–40)

## 2024-08-07 LAB — CBC WITH DIFFERENTIAL/PLATELET
Basophils Absolute: 0.1 x10E3/uL (ref 0.0–0.2)
Basos: 1 %
EOS (ABSOLUTE): 0.1 x10E3/uL (ref 0.0–0.4)
Eos: 2 %
Hematocrit: 48.1 % (ref 37.5–51.0)
Hemoglobin: 15.6 g/dL (ref 13.0–17.7)
Immature Grans (Abs): 0 x10E3/uL (ref 0.0–0.1)
Immature Granulocytes: 0 %
Lymphocytes Absolute: 1.9 x10E3/uL (ref 0.7–3.1)
Lymphs: 37 %
MCH: 30.2 pg (ref 26.6–33.0)
MCHC: 32.4 g/dL (ref 31.5–35.7)
MCV: 93 fL (ref 79–97)
Monocytes Absolute: 0.5 x10E3/uL (ref 0.1–0.9)
Monocytes: 9 %
Neutrophils Absolute: 2.6 x10E3/uL (ref 1.4–7.0)
Neutrophils: 51 %
Platelets: 266 x10E3/uL (ref 150–450)
RBC: 5.17 x10E6/uL (ref 4.14–5.80)
RDW: 12.1 % (ref 11.6–15.4)
WBC: 5.2 x10E3/uL (ref 3.4–10.8)

## 2024-08-07 LAB — HEMOGLOBIN A1C
Est. average glucose Bld gHb Est-mCnc: 117 mg/dL
Hgb A1c MFr Bld: 5.7 % — ABNORMAL HIGH (ref 4.8–5.6)

## 2024-08-07 LAB — PSA: Prostate Specific Ag, Serum: 1.7 ng/mL (ref 0.0–4.0)

## 2024-08-08 ENCOUNTER — Ambulatory Visit: Payer: Self-pay | Admitting: Internal Medicine

## 2024-08-08 ENCOUNTER — Encounter: Payer: Self-pay | Admitting: Internal Medicine

## 2024-08-08 VITALS — BP 110/70 | HR 71 | Resp 18 | Ht 63.0 in | Wt 138.0 lb

## 2024-08-08 DIAGNOSIS — Z0120 Encounter for dental examination and cleaning without abnormal findings: Secondary | ICD-10-CM

## 2024-08-08 DIAGNOSIS — R7303 Prediabetes: Secondary | ICD-10-CM

## 2024-08-08 DIAGNOSIS — Z23 Encounter for immunization: Secondary | ICD-10-CM

## 2024-08-08 DIAGNOSIS — Z0000 Encounter for general adult medical examination without abnormal findings: Secondary | ICD-10-CM

## 2024-08-08 DIAGNOSIS — E7800 Pure hypercholesterolemia, unspecified: Secondary | ICD-10-CM

## 2024-08-08 DIAGNOSIS — D128 Benign neoplasm of rectum: Secondary | ICD-10-CM

## 2024-08-08 NOTE — Progress Notes (Signed)
 Subjective:    Patient ID: Caleb Bryan, male   DOB: 09-11-1966, 58 y.o.   MRN: [id number]   HPI  Here for Male CPE:  1.  STE:  Does perform.  No concerning findings.  No family history of testicular cancer.    2.  PSA: Normal on 9/29 at 1.7.  No family history of prostate cancer.    3.  Guaiac Cards/FIT:  never.    4.  Colonoscopy: History of large rectal polyp with high grade dysplasia involving stalk.  Dr. Ganem, [facility].  Underwent transanal rectal polyp resection, which showed pathology noted above.  Margin of resection free of dysplasia.  Followed up last year with [facility] and 3 polyps found per patient.  They have moved to every 3 years going forward.  Do not have surgical path from last years colonoscopy.  Will get his records from [place] as not in Care Everywhere. .   5.  Cholesterol/Glucose:  Total cholesterol and LDL much improved and HDL higher.  His A1C improved to 5.7%.  Carries diagnosis of prediabetes.    6.  Immunizations:  Needs 3rd Hep B, influenza, pneumococcal 20 and COVID.  Only want Hep B today.   Immunization History  Administered Date(s) Administered   Hepatitis B, ADULT 12/20/2023, 02/22/2024   Influenza,inj,Quad PF,6+ Mos 08/17/2017, 07/11/2019   Influenza-Unspecified 10/10/2004, 11/14/2008   PFIZER(Purple Top)SARS-COV-2 Vaccination 02/05/2020, 04/08/2020   Tdap 05/31/2017   Zoster Recombinant(Shingrix ) 08/05/2022, 02/16/2023      No outpatient medications have been marked as taking for the 08/08/24 encounter (Office Visit) with Adella Norris, MD.   No Known Allergies  Past Medical History:  Diagnosis Date   Fatigue 08/01/2019   History of adenomatous polyp of colon    04-21-2017 TUBULAR ADENOMA   History of kidney stones    2016   Hyperlipidemia 2022   Prediabetes 2022   Rectal polyp    Excised 06/2017 with high grade dysplasia, excisional margin without dysplasea   Right calcaneal fracture 2016   Right fibular fracture 1985    Past Surgical History:  Procedure Laterality Date   COLONOSCOPY WITH PROPOFOL  N/A 04/21/2017   Procedure: COLONOSCOPY WITH PROPOFOL ;  Surgeon: Lennard Lesta FALCON, MD;  Location[facility] ENDOSCOPY;  Service: Endoscopy;  Laterality: N/A;   LIPOMA EXCISION N/A 06/16/2017   Procedure: TRANSANAL EXCISION OF RECTAL POLYP;  Surgeon: Debby Hila, MD;  Location: [facility];  Service: General;  Laterality: N/A;   Family History  Problem Relation Age of Onset   Hyperlipidemia Mother    Diabetes Father    Asthma Daughter       Review of Systems  HENT:  Negative for dental problem.   Eyes:  Negative for visual disturbance (Goes yearly, not through orange card.).  Respiratory:  Negative for shortness of breath.   Cardiovascular:  Negative for chest pain, palpitations and leg swelling.      Objective:   BP 110/70 (BP Location: Left Arm, Patient Position: Sitting, Cuff Size: Normal)   Pulse 71   Resp 18   Ht 5' 3 (1.6 m)   Wt 138 lb (62.6 kg)   BMI 24.45 kg/m   Physical Exam Constitutional:      Appearance: Normal appearance.  HENT:     Head: Normocephalic and atraumatic.     Right Ear: Tympanic membrane, ear canal and external ear normal.     Left Ear: Tympanic membrane, ear canal and external ear normal.  Nose: Nose normal.     Mouth/Throat:     Mouth: Mucous membranes are moist.     Pharynx: Oropharynx is clear.     Comments: Significant dental decay, dental loss. Eyes:     Extraocular Movements: Extraocular movements intact.     Conjunctiva/sclera: Conjunctivae normal.     Pupils: Pupils are equal, round, and reactive to light.     Comments: Discs sharp  Neck:     Thyroid: No thyroid mass or thyromegaly.  Cardiovascular:     Rate and Rhythm: Normal rate and regular rhythm.     Heart sounds: S1 normal and S2 normal. No murmur heard.    No friction rub. No S3 or S4 sounds.     Comments: No carotid bruits.  Carotid, radial, femoral, DP and PT pulses normal  and equal.   Pulmonary:     Effort: Pulmonary effort is normal.     Breath sounds: Normal breath sounds and air entry.  Abdominal:     General: Abdomen is flat. Bowel sounds are normal.     Palpations: Abdomen is soft. There is no hepatomegaly, splenomegaly or mass.     Tenderness: There is no abdominal tenderness.     Hernia: A hernia is present.  Genitourinary:    Penis: Normal and uncircumcised.      Testes:        Right: Mass or tenderness not present. Right testis is descended.        Left: Mass or tenderness not present. Left testis is descended.  Musculoskeletal:        General: Normal range of motion.     Cervical back: Normal range of motion and neck supple.     Right lower leg: No edema.     Left lower leg: No edema.  Lymphadenopathy:     Head:     Right side of head: No submental or submandibular adenopathy.     Left side of head: No submental or submandibular adenopathy.     Cervical: No cervical adenopathy.     Upper Body:     Right upper body: No supraclavicular adenopathy.     Left upper body: No supraclavicular adenopathy.     Lower Body: No right inguinal adenopathy. No left inguinal adenopathy.  Skin:    General: Skin is warm.     Capillary Refill: Capillary refill takes less than 2 seconds.     Findings: No rash.  Neurological:     General: No focal deficit present.     Mental Status: He is alert and oriented to person, place, and time.     Cranial Nerves: Cranial nerves 2-12 are intact.     Sensory: Sensation is intact.     Motor: Motor function is intact.     Coordination: Coordination is intact.     Gait: Gait is intact.     Deep Tendon Reflexes: Reflexes are normal and symmetric.  Psychiatric:        Mood and Affect: Mood normal.        Speech: Speech normal.        Behavior: Behavior normal. Behavior is cooperative.      Assessment & Plan   CPE Hep B #3 He will think about influenza, pneumococcal 20, COVID  2.  Hyperlipidemia:  much  improved and acceptable   3.  Prediabetes:  A1C almost normalized at 5.7%.    4.  Need for dental care:  needs renewed orange card and will send referral.

## 2024-08-24 ENCOUNTER — Ambulatory Visit: Payer: Self-pay | Admitting: Internal Medicine

## 2024-09-04 ENCOUNTER — Ambulatory Visit: Payer: Self-pay | Admitting: Internal Medicine

## 2025-08-09 ENCOUNTER — Other Ambulatory Visit: Payer: Self-pay | Admitting: Internal Medicine

## 2025-08-14 ENCOUNTER — Encounter: Payer: Self-pay | Admitting: Internal Medicine
